# Patient Record
Sex: Male | Born: 1940 | Race: White | Hispanic: No | Marital: Married | Smoking: Former smoker
Health system: Southern US, Community
[De-identification: ages and names within clinical notes are randomized; demographics above are authoritative.]

## PROBLEM LIST (undated history)

## (undated) DIAGNOSIS — E785 Hyperlipidemia, unspecified: Secondary | ICD-10-CM

## (undated) DIAGNOSIS — E119 Type 2 diabetes mellitus without complications: Secondary | ICD-10-CM

## (undated) DIAGNOSIS — J449 Chronic obstructive pulmonary disease, unspecified: Secondary | ICD-10-CM

## (undated) HISTORY — PX: ELBOW SURGERY: SHX618

## (undated) HISTORY — DX: Hyperlipidemia, unspecified: E78.5

## (undated) HISTORY — DX: Chronic obstructive pulmonary disease, unspecified: J44.9

## (undated) HISTORY — DX: Type 2 diabetes mellitus without complications: E11.9

---

## 2007-11-05 ENCOUNTER — Ambulatory Visit: Payer: Self-pay | Admitting: Thoracic Surgery

## 2007-11-26 ENCOUNTER — Ambulatory Visit: Payer: Self-pay | Admitting: Thoracic Surgery

## 2007-11-26 ENCOUNTER — Encounter: Admission: RE | Admit: 2007-11-26 | Discharge: 2007-11-26 | Payer: Self-pay | Admitting: Thoracic Surgery

## 2008-01-14 ENCOUNTER — Ambulatory Visit: Payer: Self-pay | Admitting: Thoracic Surgery

## 2008-01-14 ENCOUNTER — Encounter: Admission: RE | Admit: 2008-01-14 | Discharge: 2008-01-14 | Payer: Self-pay | Admitting: Thoracic Surgery

## 2008-09-16 IMAGING — CT CT CHEST W/O CM
2 of 4 series · 15 of 36 positions shown, 18 images · IV contrast (agent unspecified)
Comparison: Prior chest x-ray of 11/26/07.

CLINICAL DATA: Lung nodule ? follow up. 
CT CHEST WITHOUT CONTRAST:
TECHNIQUE: Multidetector CT imaging of the chest was performed following the standard protocol without IV contrast.

[Series 3: routine chest · axial · 0.77mm/px · z∈[-275,-5]mm · 12 of 64 slices shown, 15 images]
[im 5/64  mediastinal]
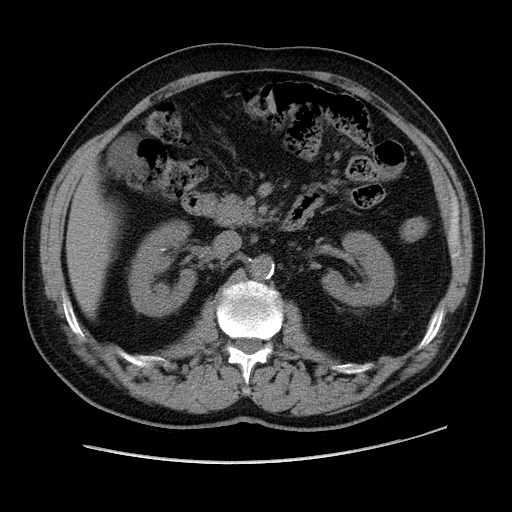
[im 5/64  lung]
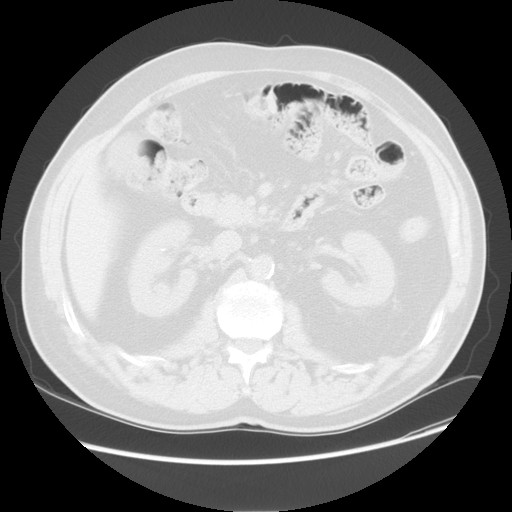
[im 10/64  lung]
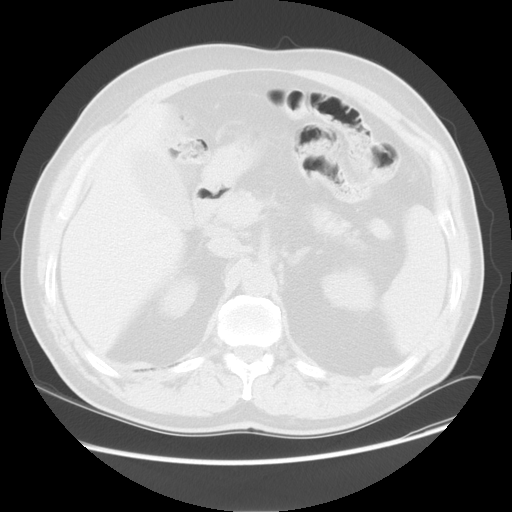
[im 14/64  lung]
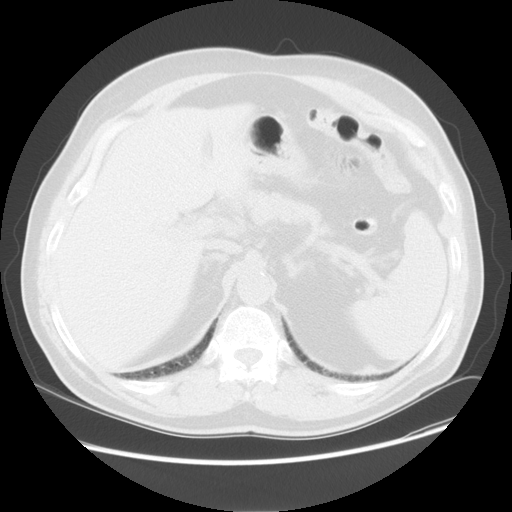
[im 19/64  lung]
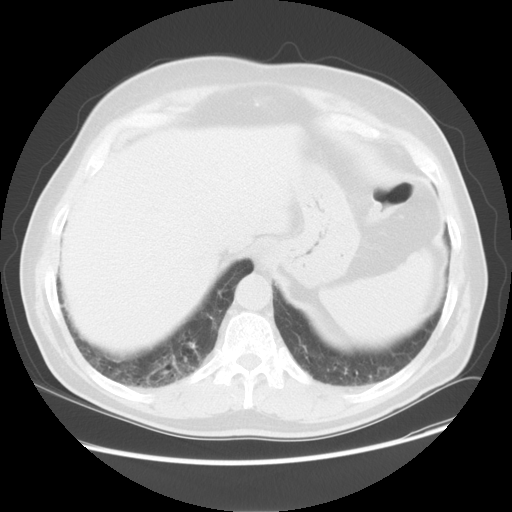
[im 23/64  mediastinal]
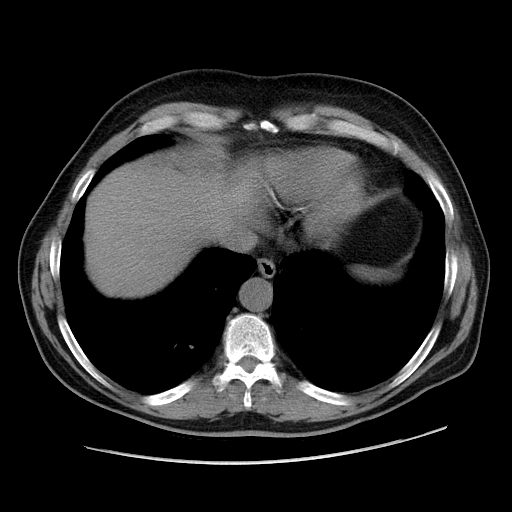
[im 23/64  lung]
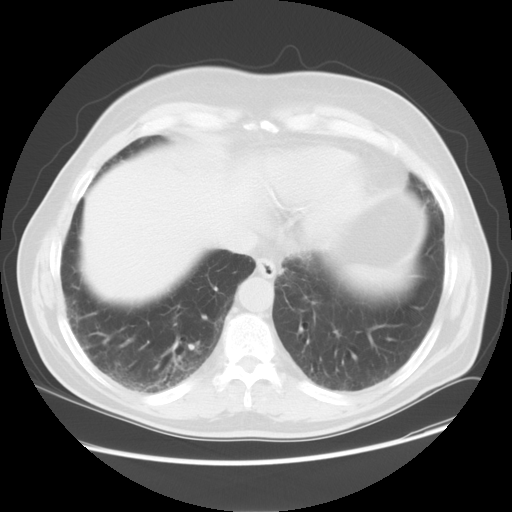
[im 28/64  lung]
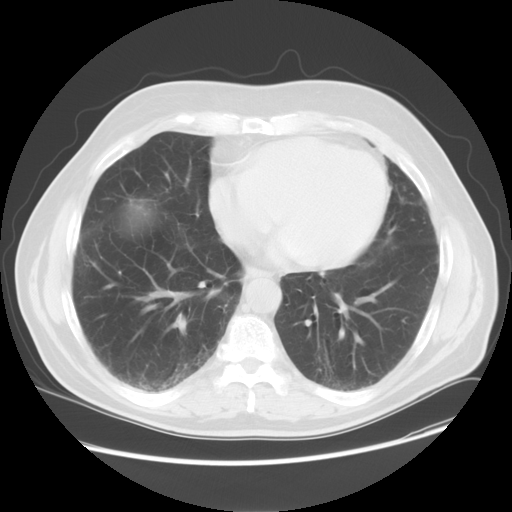
[im 37/64  lung]
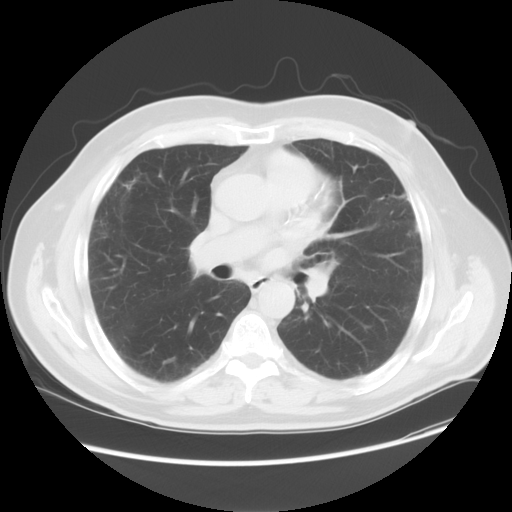
[im 41/64  lung]
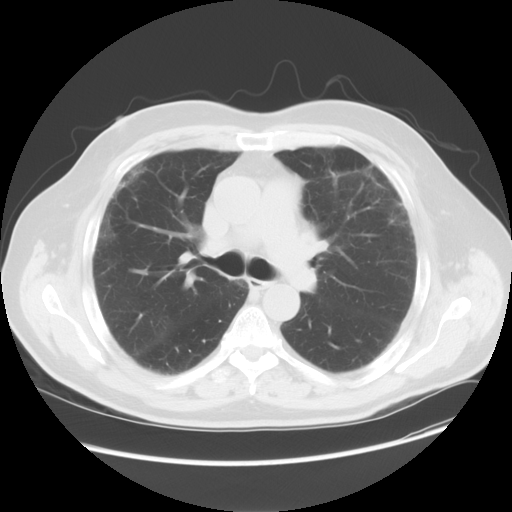
[im 46/64  mediastinal]
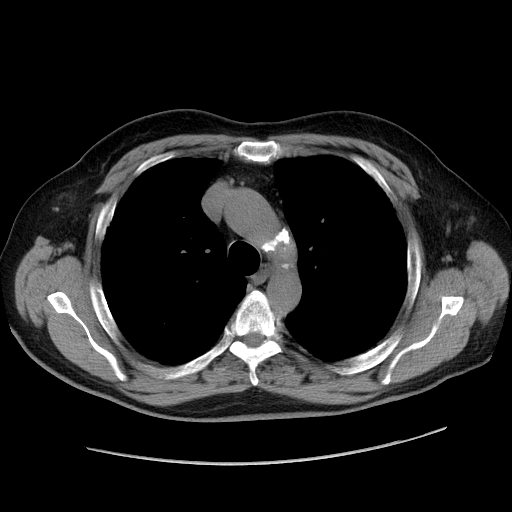
[im 46/64  lung]
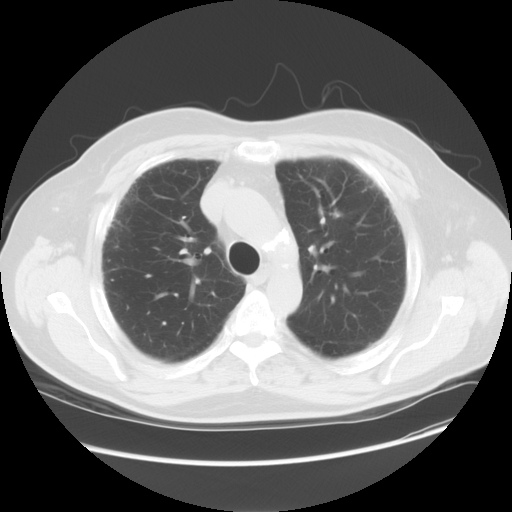
[im 50/64  lung]
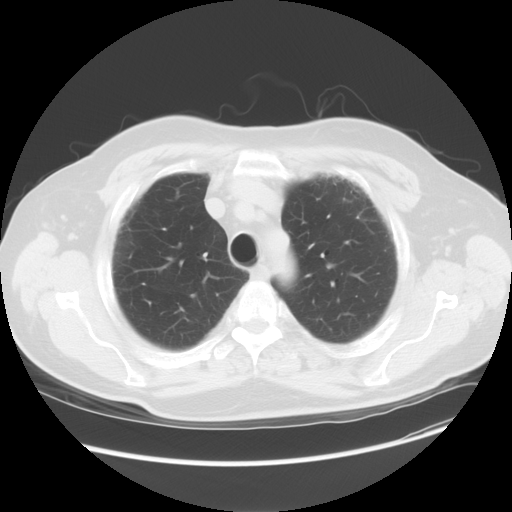
[im 55/64  lung]
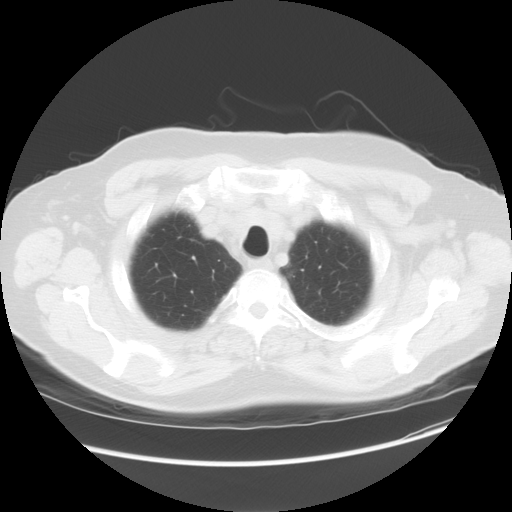
[im 59/64  lung]
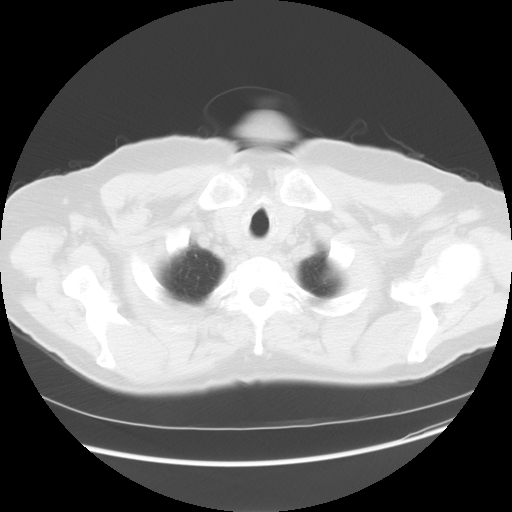

[Series 602: sagittal body · sagittal · 0.77mm/px · 3 of 158 slices shown]
[im 32/158  lung]
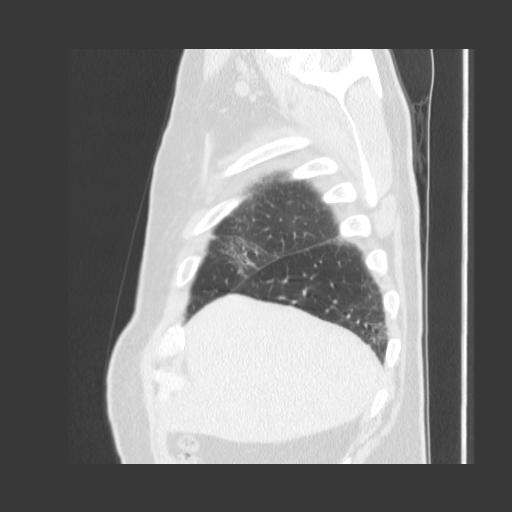
[im 63/158  lung]
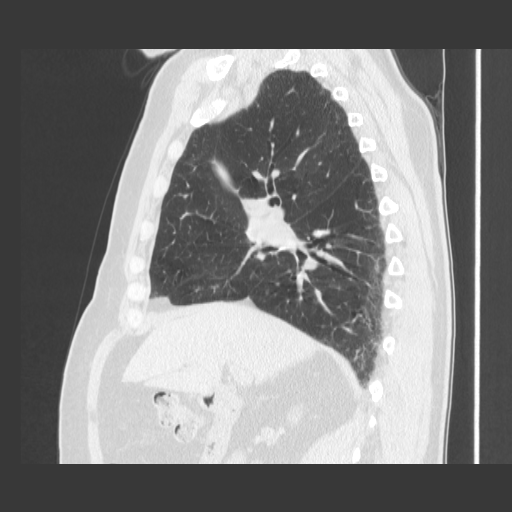
[im 95/158  lung]
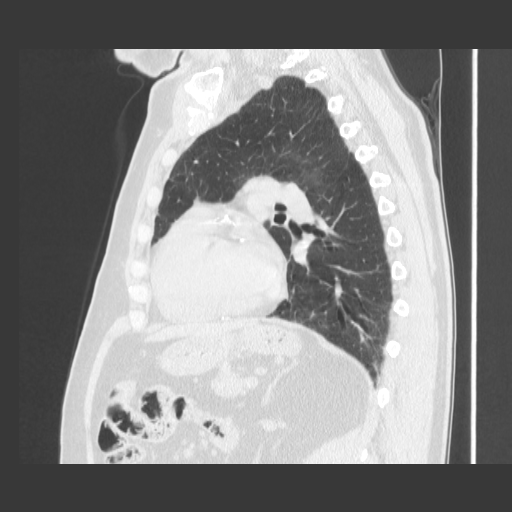

[15 of 36 positions shown; findings below may reference images not displayed]

FINDINGS: There are prominent interstitial markings primarily peripherally and predominantly in the lower lobes most consistent with chronic interstitial lung disease.  There does appear to be very mild honeycombing present and minimal traction bronchiectasis.  This pattern particularly involves the right lower lobe posteriorly and inferiorly.  No lung nodule is seen and effusion noted.  No mediastinal or hilar adenopathy is seen.  There are coronary artery calcifications present.  Bilateral non obstructing renal calculi are noted.  Partial compression of the superior endplate of L1 is noted of uncertain age.
IMPRESSION: 1.  Prominent interstitial markings primarily involving the lower lobes,  right greater than left,  with findings consistent with pulmonary fibrosis possibly UIP.  No ground glass opacity is seen. 
2.  No lung nodule is noted. 
3.  Coronary artery calcifications. 
4.  Non obstructing renal calculi. 
5.  Slight compression of L1 of question age.

## 2011-05-08 NOTE — Letter (Signed)
January 14, 2008   Tanvir A. Blenda Nicely, M.D.  [address]  [place], [place]. [address]   Re:  VALENTE, FOSBERG              DOB:  08/11/1941   Dear Dr. Blenda Nicely:   I saw Mr. Birdsall back today and repeated his CT scan.  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  I am sending him back  to see you and left you discuss the situation.  If you and he decide a  biopsy needs to be done, I will be happy to do it, but right now since  his saturations are 95% on room air, I am not sure he needs to have  anything done at the present time.   His blood pressure was 155/97, pulse 88, respirations 18.  He is still  on Coumadin.   I appreciate the opportunity to see Mr. Lowrey.   Ines Bloomer, M.D.  Electronically Signed   DPB/MEDQ  D:  01/14/2008  T:  01/14/2008  Job:  [id number]

## 2011-05-08 NOTE — Letter (Signed)
November 05, 2007   Tanvir A. Blenda Nicely, M.D.  [address]  [place], [place]. [address]   Re:  NEMO, SUDBURY              DOB:  [birth year]   Dear Dr. Blenda Nicely:   I appreciate the opportunity of seeing Edwin Burns.  This 70 year old  patient has a history of having fall respiratory tract infections with  some fever and cough.  This developed this time and got progressively  worse.  He was treated by his medical doctor for pneumonia and then had  to be admitted to the hospital and was in the hospital for 2-3 weeks  with pneumonia.  CT scan showed bilateral infiltrates with air space  disease.  He is on oxygen now, 1 liter.  He is on prednisone and has  been on antibiotics many times.  He is referred here for possible lung  biopsy.  Three days ago he actually developed swelling of his left leg,  and an ultrasound was positive for deep venous thrombosis with no  evidence of pulmonary embolus.  He had a previous transbronchial biopsy,  which was nondiagnostic.  Also, the bronchoalveolar lavage was  nondiagnostic.   PAST MEDICAL HISTORY:  Significant for hypertension,  hypercholesterolemia.   ALLERGIES:  No known drug allergies.   MEDICATIONS:  Lovenox twice daily, and he was started on 10 mg of  Coumadin.  He is on prednisone 5 mg every other day.   FAMILY HISTORY:  Positive for diabetes, cancer, and asthma.   SOCIAL HISTORY:  He is married.  He has one child.  Quit smoking in  1965.  Does not drink alcohol on a regular basis.   REVIEW OF SYSTEMS:  GENERAL:  He has had some weight loss, loss of  appetite.  He is 160 pounds and 5 feet 8 inches.  CARDIAC:  No angina or atrial fibrillation.  PULMONARY:  On home oxygen.  Has had bronchitis and a cough but no  hemoptysis.  He has also had asthma and wheezing.  GI:  No nausea, vomiting, constipation, or diarrhea.  No GERD.  GU:  Frequent urination.  VASCULAR:  He has DVT left leg.  No claudication or TIAs.  NEUROLOGIC:  No headaches,  blackouts, seizures.  MUSCULOSKELETAL:  No joint pain or rash.  PSYCHIATRIC:  No psychiatric illnesses.  EYES/ENT:  No changes in eyesight or hearing.  HEMATOLOGICAL:  No problems with anemia or clotting disorders.   PHYSICAL EXAMINATION:  General:  He is a well-developed Caucasian male  in no acute distress.  He is wearing oxygen.  His pulmonary function  test showed an FVC of 1.46 with an FEV1 of 1.56, which are 52% of  predicted.  His sats were 96% on 1 liter of oxygen.  Vital Signs:  Blood  pressure is 140/70, pulse 80, respirations 18.  Head, Eyes, Ears, Nose,  Throat:  Unremarkable.  Neck:  Supple without thyromegaly.  There is no  supraclavicular or axillary adenopathy.  Chest:  Clear to auscultation  and percussion.  Heart:  Regular sinus rhythm.  No murmurs.  Abdomen:  Soft.  There is no hepatosplenomegaly.  Extremities:  Pulses are 2+.  There is no clubbing or edema.  Neurological:  Intact.   I feel that a lung biopsy may be beneficial.  However, with having a  recent DVT, I would prefer to wait 2-3 weeks before considering a lung  biopsy, so I recommend that he come back with  a chest x-ray in 3 weeks,  and at that time we will tend towards scheduling his lung biopsy since  he will be out from his DVT.   I appreciate the opportunity to see Edwin Burns.   Sincerely,   Ines Bloomer, M.D.  Electronically Signed   DPB/MEDQ  D:  11/05/2007  T:  11/06/2007  Job:  [id number]

## 2011-05-08 NOTE — Letter (Signed)
November 26, 2007   Tanvir A. Blenda Nicely, M.D.  [address]  [place], [place]. [address]   Re:  AYAN, HEFFINGTON              DOB:  1941/11/29   Dear Dr. Blenda Nicely:   I saw Mr. Grudzien back in the office today. His chest x-ray still shows  peripheral interstitial opacities, but overall, he is stable. His  saturations off of room air were 97%. His blood pressure is 130/88,  pulse 89, and respirations 18.   We talked about doing a lung biopsy, but he still is on Coumadin from  his DVT and I would think that we could wait until after the new year  before proceeding with a biopsy. I will plan to get a repeat CT scan at  that time to look at his lungs, as well as to make sure that there is  complete resolution of his pulmonary emboli. At that time, my view is  that we can go ahead and do his lung biopsy, which I think would be  safe. Since his pulmonary status has improved, I, again, think it would  be worth getting a CT scan prior to proceeding with any type of biopsy.   Ines Bloomer, M.D.  Electronically Signed   DPB/MEDQ  D:  11/26/2007  T:  11/27/2007  Job:  [id number]

## 2012-02-28 DIAGNOSIS — I10 Essential (primary) hypertension: Secondary | ICD-10-CM | POA: Diagnosis not present

## 2012-02-28 DIAGNOSIS — E782 Mixed hyperlipidemia: Secondary | ICD-10-CM | POA: Diagnosis not present

## 2012-07-07 DIAGNOSIS — I10 Essential (primary) hypertension: Secondary | ICD-10-CM | POA: Diagnosis not present

## 2012-07-07 DIAGNOSIS — E782 Mixed hyperlipidemia: Secondary | ICD-10-CM | POA: Diagnosis not present

## 2012-08-04 DIAGNOSIS — IMO0001 Reserved for inherently not codable concepts without codable children: Secondary | ICD-10-CM | POA: Diagnosis not present

## 2012-08-04 DIAGNOSIS — Z125 Encounter for screening for malignant neoplasm of prostate: Secondary | ICD-10-CM | POA: Diagnosis not present

## 2012-08-04 DIAGNOSIS — F329 Major depressive disorder, single episode, unspecified: Secondary | ICD-10-CM | POA: Diagnosis not present

## 2012-10-10 DIAGNOSIS — Z23 Encounter for immunization: Secondary | ICD-10-CM | POA: Diagnosis not present

## 2012-10-10 DIAGNOSIS — Z125 Encounter for screening for malignant neoplasm of prostate: Secondary | ICD-10-CM | POA: Diagnosis not present

## 2012-10-29 DIAGNOSIS — Z23 Encounter for immunization: Secondary | ICD-10-CM | POA: Diagnosis not present

## 2012-11-25 DIAGNOSIS — N401 Enlarged prostate with lower urinary tract symptoms: Secondary | ICD-10-CM | POA: Diagnosis not present

## 2012-11-25 DIAGNOSIS — R972 Elevated prostate specific antigen [PSA]: Secondary | ICD-10-CM | POA: Diagnosis not present

## 2012-12-10 DIAGNOSIS — J111 Influenza due to unidentified influenza virus with other respiratory manifestations: Secondary | ICD-10-CM | POA: Diagnosis not present

## 2012-12-10 DIAGNOSIS — R062 Wheezing: Secondary | ICD-10-CM | POA: Diagnosis not present

## 2012-12-15 DIAGNOSIS — N401 Enlarged prostate with lower urinary tract symptoms: Secondary | ICD-10-CM | POA: Diagnosis not present

## 2012-12-15 DIAGNOSIS — R972 Elevated prostate specific antigen [PSA]: Secondary | ICD-10-CM | POA: Diagnosis not present

## 2013-01-05 DIAGNOSIS — E782 Mixed hyperlipidemia: Secondary | ICD-10-CM | POA: Diagnosis not present

## 2013-01-05 DIAGNOSIS — J449 Chronic obstructive pulmonary disease, unspecified: Secondary | ICD-10-CM | POA: Diagnosis not present

## 2013-01-05 DIAGNOSIS — I10 Essential (primary) hypertension: Secondary | ICD-10-CM | POA: Diagnosis not present

## 2013-01-05 DIAGNOSIS — R972 Elevated prostate specific antigen [PSA]: Secondary | ICD-10-CM | POA: Diagnosis not present

## 2013-01-28 DIAGNOSIS — J449 Chronic obstructive pulmonary disease, unspecified: Secondary | ICD-10-CM | POA: Diagnosis not present

## 2013-03-12 DIAGNOSIS — N401 Enlarged prostate with lower urinary tract symptoms: Secondary | ICD-10-CM | POA: Diagnosis not present

## 2013-03-12 DIAGNOSIS — R972 Elevated prostate specific antigen [PSA]: Secondary | ICD-10-CM | POA: Diagnosis not present

## 2013-03-23 DIAGNOSIS — R972 Elevated prostate specific antigen [PSA]: Secondary | ICD-10-CM | POA: Diagnosis not present

## 2013-03-23 DIAGNOSIS — N401 Enlarged prostate with lower urinary tract symptoms: Secondary | ICD-10-CM | POA: Diagnosis not present

## 2013-06-11 DIAGNOSIS — I10 Essential (primary) hypertension: Secondary | ICD-10-CM | POA: Diagnosis not present

## 2013-06-11 DIAGNOSIS — E782 Mixed hyperlipidemia: Secondary | ICD-10-CM | POA: Diagnosis not present

## 2013-06-24 DIAGNOSIS — R972 Elevated prostate specific antigen [PSA]: Secondary | ICD-10-CM | POA: Diagnosis not present

## 2013-06-24 DIAGNOSIS — N401 Enlarged prostate with lower urinary tract symptoms: Secondary | ICD-10-CM | POA: Diagnosis not present

## 2013-10-12 DIAGNOSIS — Z23 Encounter for immunization: Secondary | ICD-10-CM | POA: Diagnosis not present

## 2013-12-28 DIAGNOSIS — N3289 Other specified disorders of bladder: Secondary | ICD-10-CM | POA: Diagnosis not present

## 2013-12-28 DIAGNOSIS — R972 Elevated prostate specific antigen [PSA]: Secondary | ICD-10-CM | POA: Diagnosis not present

## 2013-12-28 DIAGNOSIS — N401 Enlarged prostate with lower urinary tract symptoms: Secondary | ICD-10-CM | POA: Diagnosis not present

## 2013-12-28 DIAGNOSIS — N138 Other obstructive and reflux uropathy: Secondary | ICD-10-CM | POA: Diagnosis not present

## 2014-01-14 DIAGNOSIS — I10 Essential (primary) hypertension: Secondary | ICD-10-CM | POA: Diagnosis not present

## 2014-01-14 DIAGNOSIS — E782 Mixed hyperlipidemia: Secondary | ICD-10-CM | POA: Diagnosis not present

## 2014-01-25 DIAGNOSIS — Z139 Encounter for screening, unspecified: Secondary | ICD-10-CM | POA: Diagnosis not present

## 2014-01-25 DIAGNOSIS — Z6829 Body mass index (BMI) 29.0-29.9, adult: Secondary | ICD-10-CM | POA: Diagnosis not present

## 2014-01-25 DIAGNOSIS — Z1389 Encounter for screening for other disorder: Secondary | ICD-10-CM | POA: Diagnosis not present

## 2014-01-25 DIAGNOSIS — Z0000 Encounter for general adult medical examination without abnormal findings: Secondary | ICD-10-CM | POA: Diagnosis not present

## 2014-01-25 DIAGNOSIS — E782 Mixed hyperlipidemia: Secondary | ICD-10-CM | POA: Diagnosis not present

## 2014-01-25 DIAGNOSIS — I10 Essential (primary) hypertension: Secondary | ICD-10-CM | POA: Diagnosis not present

## 2014-01-25 DIAGNOSIS — Z1331 Encounter for screening for depression: Secondary | ICD-10-CM | POA: Diagnosis not present

## 2014-05-19 DIAGNOSIS — L821 Other seborrheic keratosis: Secondary | ICD-10-CM | POA: Diagnosis not present

## 2014-05-19 DIAGNOSIS — B079 Viral wart, unspecified: Secondary | ICD-10-CM | POA: Diagnosis not present

## 2014-05-19 DIAGNOSIS — D485 Neoplasm of uncertain behavior of skin: Secondary | ICD-10-CM | POA: Diagnosis not present

## 2014-05-19 DIAGNOSIS — L578 Other skin changes due to chronic exposure to nonionizing radiation: Secondary | ICD-10-CM | POA: Diagnosis not present

## 2014-05-19 DIAGNOSIS — L570 Actinic keratosis: Secondary | ICD-10-CM | POA: Diagnosis not present

## 2014-06-28 DIAGNOSIS — N138 Other obstructive and reflux uropathy: Secondary | ICD-10-CM | POA: Diagnosis not present

## 2014-06-28 DIAGNOSIS — N401 Enlarged prostate with lower urinary tract symptoms: Secondary | ICD-10-CM | POA: Diagnosis not present

## 2014-06-28 DIAGNOSIS — R972 Elevated prostate specific antigen [PSA]: Secondary | ICD-10-CM | POA: Diagnosis not present

## 2014-07-19 DIAGNOSIS — E782 Mixed hyperlipidemia: Secondary | ICD-10-CM | POA: Diagnosis not present

## 2014-07-19 DIAGNOSIS — I10 Essential (primary) hypertension: Secondary | ICD-10-CM | POA: Diagnosis not present

## 2014-07-20 DIAGNOSIS — L821 Other seborrheic keratosis: Secondary | ICD-10-CM | POA: Diagnosis not present

## 2014-07-20 DIAGNOSIS — L570 Actinic keratosis: Secondary | ICD-10-CM | POA: Diagnosis not present

## 2014-07-26 DIAGNOSIS — Z6828 Body mass index (BMI) 28.0-28.9, adult: Secondary | ICD-10-CM | POA: Diagnosis not present

## 2014-07-26 DIAGNOSIS — E782 Mixed hyperlipidemia: Secondary | ICD-10-CM | POA: Diagnosis not present

## 2014-07-26 DIAGNOSIS — I10 Essential (primary) hypertension: Secondary | ICD-10-CM | POA: Diagnosis not present

## 2014-07-26 DIAGNOSIS — R7301 Impaired fasting glucose: Secondary | ICD-10-CM | POA: Diagnosis not present

## 2014-09-10 DIAGNOSIS — Z23 Encounter for immunization: Secondary | ICD-10-CM | POA: Diagnosis not present

## 2014-12-29 DIAGNOSIS — N401 Enlarged prostate with lower urinary tract symptoms: Secondary | ICD-10-CM | POA: Diagnosis not present

## 2014-12-29 DIAGNOSIS — R972 Elevated prostate specific antigen [PSA]: Secondary | ICD-10-CM | POA: Diagnosis not present

## 2015-01-26 DIAGNOSIS — E782 Mixed hyperlipidemia: Secondary | ICD-10-CM | POA: Diagnosis not present

## 2015-01-26 DIAGNOSIS — R7301 Impaired fasting glucose: Secondary | ICD-10-CM | POA: Diagnosis not present

## 2015-01-26 DIAGNOSIS — I10 Essential (primary) hypertension: Secondary | ICD-10-CM | POA: Diagnosis not present

## 2015-02-07 DIAGNOSIS — I10 Essential (primary) hypertension: Secondary | ICD-10-CM | POA: Diagnosis not present

## 2015-02-07 DIAGNOSIS — Z6829 Body mass index (BMI) 29.0-29.9, adult: Secondary | ICD-10-CM | POA: Diagnosis not present

## 2015-02-07 DIAGNOSIS — R7301 Impaired fasting glucose: Secondary | ICD-10-CM | POA: Diagnosis not present

## 2015-06-02 DIAGNOSIS — N401 Enlarged prostate with lower urinary tract symptoms: Secondary | ICD-10-CM | POA: Diagnosis not present

## 2015-06-02 DIAGNOSIS — N3289 Other specified disorders of bladder: Secondary | ICD-10-CM | POA: Diagnosis not present

## 2015-06-29 DIAGNOSIS — R972 Elevated prostate specific antigen [PSA]: Secondary | ICD-10-CM | POA: Diagnosis not present

## 2015-06-29 DIAGNOSIS — N3289 Other specified disorders of bladder: Secondary | ICD-10-CM | POA: Diagnosis not present

## 2015-06-29 DIAGNOSIS — N401 Enlarged prostate with lower urinary tract symptoms: Secondary | ICD-10-CM | POA: Diagnosis not present

## 2015-07-18 DIAGNOSIS — L578 Other skin changes due to chronic exposure to nonionizing radiation: Secondary | ICD-10-CM | POA: Diagnosis not present

## 2015-07-18 DIAGNOSIS — L570 Actinic keratosis: Secondary | ICD-10-CM | POA: Diagnosis not present

## 2015-07-18 DIAGNOSIS — L80 Vitiligo: Secondary | ICD-10-CM | POA: Diagnosis not present

## 2015-08-01 DIAGNOSIS — N210 Calculus in bladder: Secondary | ICD-10-CM | POA: Diagnosis not present

## 2015-08-01 DIAGNOSIS — E782 Mixed hyperlipidemia: Secondary | ICD-10-CM | POA: Diagnosis not present

## 2015-08-01 DIAGNOSIS — I10 Essential (primary) hypertension: Secondary | ICD-10-CM | POA: Diagnosis not present

## 2015-08-01 DIAGNOSIS — R7301 Impaired fasting glucose: Secondary | ICD-10-CM | POA: Diagnosis not present

## 2015-08-01 DIAGNOSIS — N200 Calculus of kidney: Secondary | ICD-10-CM | POA: Diagnosis not present

## 2015-08-01 DIAGNOSIS — R32 Unspecified urinary incontinence: Secondary | ICD-10-CM | POA: Diagnosis not present

## 2015-08-01 DIAGNOSIS — N3289 Other specified disorders of bladder: Secondary | ICD-10-CM | POA: Diagnosis not present

## 2015-08-01 DIAGNOSIS — N401 Enlarged prostate with lower urinary tract symptoms: Secondary | ICD-10-CM | POA: Diagnosis not present

## 2015-08-01 DIAGNOSIS — R350 Frequency of micturition: Secondary | ICD-10-CM | POA: Diagnosis not present

## 2015-08-08 DIAGNOSIS — E1169 Type 2 diabetes mellitus with other specified complication: Secondary | ICD-10-CM | POA: Diagnosis not present

## 2015-08-08 DIAGNOSIS — E785 Hyperlipidemia, unspecified: Secondary | ICD-10-CM | POA: Diagnosis not present

## 2015-08-08 DIAGNOSIS — E118 Type 2 diabetes mellitus with unspecified complications: Secondary | ICD-10-CM | POA: Diagnosis not present

## 2015-08-08 DIAGNOSIS — E1159 Type 2 diabetes mellitus with other circulatory complications: Secondary | ICD-10-CM | POA: Diagnosis not present

## 2015-08-11 DIAGNOSIS — Z7982 Long term (current) use of aspirin: Secondary | ICD-10-CM | POA: Diagnosis not present

## 2015-08-11 DIAGNOSIS — N210 Calculus in bladder: Secondary | ICD-10-CM | POA: Diagnosis not present

## 2015-08-11 DIAGNOSIS — E785 Hyperlipidemia, unspecified: Secondary | ICD-10-CM | POA: Diagnosis not present

## 2015-08-11 DIAGNOSIS — I10 Essential (primary) hypertension: Secondary | ICD-10-CM | POA: Diagnosis not present

## 2015-08-11 DIAGNOSIS — N3289 Other specified disorders of bladder: Secondary | ICD-10-CM | POA: Diagnosis not present

## 2015-08-11 DIAGNOSIS — N201 Calculus of ureter: Secondary | ICD-10-CM | POA: Diagnosis not present

## 2015-08-11 DIAGNOSIS — N401 Enlarged prostate with lower urinary tract symptoms: Secondary | ICD-10-CM | POA: Diagnosis not present

## 2015-08-11 DIAGNOSIS — N209 Urinary calculus, unspecified: Secondary | ICD-10-CM | POA: Diagnosis not present

## 2015-08-11 DIAGNOSIS — Z79899 Other long term (current) drug therapy: Secondary | ICD-10-CM | POA: Diagnosis not present

## 2015-08-12 DIAGNOSIS — N401 Enlarged prostate with lower urinary tract symptoms: Secondary | ICD-10-CM | POA: Diagnosis not present

## 2015-08-12 DIAGNOSIS — N210 Calculus in bladder: Secondary | ICD-10-CM | POA: Diagnosis not present

## 2015-08-31 DIAGNOSIS — Z1389 Encounter for screening for other disorder: Secondary | ICD-10-CM | POA: Diagnosis not present

## 2015-08-31 DIAGNOSIS — Z0000 Encounter for general adult medical examination without abnormal findings: Secondary | ICD-10-CM | POA: Diagnosis not present

## 2015-08-31 DIAGNOSIS — Z139 Encounter for screening, unspecified: Secondary | ICD-10-CM | POA: Diagnosis not present

## 2015-09-02 DIAGNOSIS — N401 Enlarged prostate with lower urinary tract symptoms: Secondary | ICD-10-CM | POA: Diagnosis not present

## 2015-09-02 DIAGNOSIS — N3289 Other specified disorders of bladder: Secondary | ICD-10-CM | POA: Diagnosis not present

## 2015-09-19 DIAGNOSIS — D485 Neoplasm of uncertain behavior of skin: Secondary | ICD-10-CM | POA: Diagnosis not present

## 2015-09-19 DIAGNOSIS — L570 Actinic keratosis: Secondary | ICD-10-CM | POA: Diagnosis not present

## 2015-09-19 DIAGNOSIS — L304 Erythema intertrigo: Secondary | ICD-10-CM | POA: Diagnosis not present

## 2015-09-19 DIAGNOSIS — L820 Inflamed seborrheic keratosis: Secondary | ICD-10-CM | POA: Diagnosis not present

## 2015-09-19 DIAGNOSIS — L819 Disorder of pigmentation, unspecified: Secondary | ICD-10-CM | POA: Diagnosis not present

## 2015-10-15 DIAGNOSIS — Z23 Encounter for immunization: Secondary | ICD-10-CM | POA: Diagnosis not present

## 2015-10-26 DIAGNOSIS — R972 Elevated prostate specific antigen [PSA]: Secondary | ICD-10-CM | POA: Diagnosis not present

## 2015-10-26 DIAGNOSIS — N3289 Other specified disorders of bladder: Secondary | ICD-10-CM | POA: Diagnosis not present

## 2015-10-26 DIAGNOSIS — N401 Enlarged prostate with lower urinary tract symptoms: Secondary | ICD-10-CM | POA: Diagnosis not present

## 2015-11-30 DIAGNOSIS — R7301 Impaired fasting glucose: Secondary | ICD-10-CM | POA: Diagnosis not present

## 2015-11-30 DIAGNOSIS — I10 Essential (primary) hypertension: Secondary | ICD-10-CM | POA: Diagnosis not present

## 2015-11-30 DIAGNOSIS — E782 Mixed hyperlipidemia: Secondary | ICD-10-CM | POA: Diagnosis not present

## 2015-12-12 DIAGNOSIS — E785 Hyperlipidemia, unspecified: Secondary | ICD-10-CM | POA: Diagnosis not present

## 2015-12-12 DIAGNOSIS — Z6828 Body mass index (BMI) 28.0-28.9, adult: Secondary | ICD-10-CM | POA: Diagnosis not present

## 2015-12-12 DIAGNOSIS — M2540 Effusion, unspecified joint: Secondary | ICD-10-CM | POA: Diagnosis not present

## 2015-12-12 DIAGNOSIS — R7303 Prediabetes: Secondary | ICD-10-CM | POA: Diagnosis not present

## 2016-04-24 DIAGNOSIS — N401 Enlarged prostate with lower urinary tract symptoms: Secondary | ICD-10-CM | POA: Diagnosis not present

## 2016-04-24 DIAGNOSIS — R972 Elevated prostate specific antigen [PSA]: Secondary | ICD-10-CM | POA: Diagnosis not present

## 2016-05-29 DIAGNOSIS — E785 Hyperlipidemia, unspecified: Secondary | ICD-10-CM | POA: Diagnosis not present

## 2016-05-29 DIAGNOSIS — I10 Essential (primary) hypertension: Secondary | ICD-10-CM | POA: Diagnosis not present

## 2016-05-29 DIAGNOSIS — R7303 Prediabetes: Secondary | ICD-10-CM | POA: Diagnosis not present

## 2016-06-12 DIAGNOSIS — L80 Vitiligo: Secondary | ICD-10-CM | POA: Diagnosis not present

## 2016-06-12 DIAGNOSIS — E785 Hyperlipidemia, unspecified: Secondary | ICD-10-CM | POA: Diagnosis not present

## 2016-06-12 DIAGNOSIS — Z1389 Encounter for screening for other disorder: Secondary | ICD-10-CM | POA: Diagnosis not present

## 2016-06-12 DIAGNOSIS — I10 Essential (primary) hypertension: Secondary | ICD-10-CM | POA: Diagnosis not present

## 2016-06-12 DIAGNOSIS — R05 Cough: Secondary | ICD-10-CM | POA: Diagnosis not present

## 2016-08-22 DIAGNOSIS — Z6828 Body mass index (BMI) 28.0-28.9, adult: Secondary | ICD-10-CM | POA: Diagnosis not present

## 2016-08-22 DIAGNOSIS — K4090 Unilateral inguinal hernia, without obstruction or gangrene, not specified as recurrent: Secondary | ICD-10-CM | POA: Diagnosis not present

## 2016-09-18 DIAGNOSIS — K4090 Unilateral inguinal hernia, without obstruction or gangrene, not specified as recurrent: Secondary | ICD-10-CM | POA: Diagnosis not present

## 2016-09-18 DIAGNOSIS — D171 Benign lipomatous neoplasm of skin and subcutaneous tissue of trunk: Secondary | ICD-10-CM | POA: Diagnosis not present

## 2016-09-18 DIAGNOSIS — K429 Umbilical hernia without obstruction or gangrene: Secondary | ICD-10-CM | POA: Diagnosis not present

## 2016-12-04 DIAGNOSIS — I10 Essential (primary) hypertension: Secondary | ICD-10-CM | POA: Diagnosis not present

## 2016-12-04 DIAGNOSIS — E785 Hyperlipidemia, unspecified: Secondary | ICD-10-CM | POA: Diagnosis not present

## 2016-12-04 DIAGNOSIS — R7303 Prediabetes: Secondary | ICD-10-CM | POA: Diagnosis not present

## 2016-12-10 DIAGNOSIS — R7303 Prediabetes: Secondary | ICD-10-CM | POA: Diagnosis not present

## 2016-12-10 DIAGNOSIS — E785 Hyperlipidemia, unspecified: Secondary | ICD-10-CM | POA: Diagnosis not present

## 2016-12-10 DIAGNOSIS — J449 Chronic obstructive pulmonary disease, unspecified: Secondary | ICD-10-CM | POA: Diagnosis not present

## 2016-12-10 DIAGNOSIS — I10 Essential (primary) hypertension: Secondary | ICD-10-CM | POA: Diagnosis not present

## 2017-06-11 DIAGNOSIS — R7303 Prediabetes: Secondary | ICD-10-CM | POA: Diagnosis not present

## 2017-06-11 DIAGNOSIS — E785 Hyperlipidemia, unspecified: Secondary | ICD-10-CM | POA: Diagnosis not present

## 2017-06-11 DIAGNOSIS — I10 Essential (primary) hypertension: Secondary | ICD-10-CM | POA: Diagnosis not present

## 2020-04-06 ENCOUNTER — Other Ambulatory Visit: Payer: Self-pay

## 2020-04-06 ENCOUNTER — Encounter: Payer: Self-pay | Admitting: Pulmonary Disease

## 2020-04-06 ENCOUNTER — Ambulatory Visit: Payer: Medicare Other | Admitting: Pulmonary Disease

## 2020-04-06 VITALS — BP 112/75 | HR 67 | Temp 97.4°F | Ht 64.76 in | Wt 154.2 lb

## 2020-04-06 DIAGNOSIS — J849 Interstitial pulmonary disease, unspecified: Secondary | ICD-10-CM | POA: Diagnosis not present

## 2020-04-06 LAB — SEDIMENTATION RATE: Sed Rate: 35 mm/hr — ABNORMAL HIGH (ref 0–20)

## 2020-04-06 NOTE — Progress Notes (Signed)
Edwin Burns    [id number]    1941-11-10  Primary Care Physician:Hodges, Para March, MD  Referring Physician: Charlott Rakes, MD 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 olmesartan-hydrochlorothiazide (BENICAR HCT) 40-12.5 MG tablet Take 1 tablet by mouth daily.  . ranitidine (ZANTAC) 15 MG/ML syrup Take 10 mg by mouth 2 (two) times daily.  . rosuvastatin (CRESTOR) 5 MG tablet Take 5 mg by mouth daily.  . tamsulosin (FLOMAX) 0.4 MG CAPS capsule Take 0.8 mg by mouth daily after breakfast.  . Turmeric (QC TUMERIC COMPLEX) 500 MG CAPS Take 1,000 mg by mouth daily.   No facility-administered encounter medications on file as of 04/06/2020.    Allergies as of 04/06/2020 - Review Complete 04/06/2020  Allergen Reaction Noted  . Ambien [zolpidem] Other (See Comments) 04/06/2020    History reviewed.  History significant for hyperlipidemia Hypertension Chronic obstructive pulmonary disease Prediabetic  Family  history significant for father having diabetes Mother had Hodgkin's lymphoma   Social History   Socioeconomic History  . Marital status: Married    Spouse name: Not on file  . Number of children: Not on file  . Years of education: Not on file  . Highest education level: Not on file  Occupational History  . Not on file  Tobacco Use  . Smoking status: Former Smoker    Packs/day: 1.00    Years: 15.00    Pack years: 15.00    Types: Cigarettes    Quit date: 1965    Years since quitting: 56.3  . Smokeless tobacco: Never Used  Substance and Sexual Activity  . Alcohol use: Not on file  . Drug use: Not on file  . Sexual activity: Not on file  Other Topics Concern  . Not on file  Social History Narrative  . Not on file   Social Determinants of Health   Financial Resource Strain:   . Difficulty of Paying Living Expenses:   Food Insecurity:   . Worried About Programme researcher, broadcasting/film/video in the Last Year:   . Barista in the Last Year:   Transportation Needs:   . Freight forwarder (Medical):   Marland Kitchen Lack of Transportation (Non-Medical):   Physical Activity:   . Days of Exercise per Week:   . Minutes of Exercise per Session:   Stress:   . Feeling of Stress :   Social Connections:   . Frequency of Communication with Friends and Family:   . Frequency of Social Gatherings with Friends and  Family:   . Attends Religious Services:   . Active Member of Clubs or Organizations:   . Attends Banker Meetings:   Marland Kitchen Marital Status:   Intimate Partner Violence:   . Fear of Current or Ex-Partner:   . Emotionally Abused:   Marland Kitchen Physically Abused:   . Sexually Abused:     Review of Systems  Constitutional: Negative.   HENT: Negative.   Respiratory: Negative for cough and shortness of breath.   Musculoskeletal: Positive for arthralgias and back pain.  Skin: Negative.     Vitals:   04/06/20 1344  BP: 112/75  Pulse: 67  Temp: (!) 97.4 F (36.3 C)  SpO2: 97%      Physical Exam  Constitutional: He appears well-developed and well-nourished.  HENT:  Head: Normocephalic and atraumatic.  Eyes: Pupils are equal, round, and reactive to light. Conjunctivae are normal. Right eye exhibits no discharge.  Neck: No tracheal deviation present. No thyromegaly present.  Cardiovascular: Normal rate and regular rhythm.  Pulmonary/Chest: Effort normal. No respiratory distress. He has no wheezes. He has rales. He exhibits no tenderness.  Abdominal: Soft.  Musculoskeletal:        General: No edema. Normal range of motion.     Cervical back: Normal range of motion and neck supple.  Neurological: He is alert.  Skin: Skin is warm and dry. No erythema.  Psychiatric: He has a normal mood and affect.     Data Reviewed: CT scan of upper chest performed at [facility] on 01/05/2020 does reveal basilar predominant subpleural interstitial fibrotic change with honeycombing, bronchiectasis and bronchiolectasis Previous CT from 2008 was reviewed showing extensive multifocal infiltrate. 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 -Patient has no significant symptoms at present, denies cough, denies shortness of breath -He is functioning relatively well apart from limitations related to his hip and knee pain  Predisposing factors to pulmonary fibrosis discussed  Plan/Recommendations: Obtain pulmonary function function test 6-minute walk Serum markers ANA, rheumatoid factor, SCL 70, Sjogren's syndrome antibodies Hypersensitivity pneumonitis AntidsDNA, ANCA  I did provide information about Ofev and Esbriet  I will see him back in the office in about 4 weeks Encouraged to call with any significant concerns His son was present during the visit  He is aware that this is a progressive disease   Virl Diamond MD [facility] 04/06/2020, 9:11 PM  CC: Charlott Rakes, MD

## 2020-04-06 NOTE — Patient Instructions (Signed)
You were seen today for scarring in the lungs  We will get some blood work, breathing study to assess predisposing factors  I will see you in 4 weeks  Medications used to treat scarring -OFEV -ESBRIET  The hope is to slow down the process We have no test that tells [place] will progress rapidly It is a progressive disease  Call with concerns

## 2020-04-08 LAB — ANCA SCREEN W REFLEX TITER: ANCA Screen: NEGATIVE

## 2020-04-08 LAB — ANTI-SCLERODERMA ANTIBODY: Scleroderma (Scl-70) (ENA) Antibody, IgG: <1 AI

## 2020-04-08 LAB — ANTI-NUCLEAR AB-TITER (ANA TITER): ANA Titer 1: 1:320 {titer} — ABNORMAL HIGH

## 2020-04-08 LAB — CYCLIC CITRUL PEPTIDE ANTIBODY, IGG: Cyclic Citrullin Peptide Ab: <16 UNITS

## 2020-04-08 LAB — SJOGREN'S SYNDROME ANTIBODS(SSA + SSB)
SSA (Ro) (ENA) Antibody, IgG: <1 AI
SSB (La) (ENA) Antibody, IgG: <1 AI

## 2020-04-08 LAB — RHEUMATOID FACTOR: Rhuematoid fact SerPl-aCnc: <14 IU/mL (ref <14)

## 2020-04-08 LAB — ANA: Anti Nuclear Antibody (ANA): POSITIVE — AB

## 2020-04-08 LAB — ANTI-DNA ANTIBODY, DOUBLE-STRANDED: ds DNA Ab: 9 IU/mL — ABNORMAL HIGH

## 2020-04-11 LAB — HYPERSENSITIVITY PNEUMONITIS
A. Pullulans Abs: NEGATIVE
A.Fumigatus #1 Abs: NEGATIVE
Micropolyspora faeni, IgG: NEGATIVE
Pigeon Serum Abs: NEGATIVE
Thermoact. Saccharii: NEGATIVE
Thermoactinomyces vulgaris, IgG: NEGATIVE

## 2020-04-26 ENCOUNTER — Other Ambulatory Visit (HOSPITAL_COMMUNITY)
Admission: RE | Admit: 2020-04-26 | Discharge: 2020-04-26 | Disposition: A | Discharge status: Home / Self Care | Payer: Medicare Other | Source: Ambulatory Visit | Attending: Pulmonary Disease | Admitting: Pulmonary Disease

## 2020-04-26 DIAGNOSIS — Z20822 Contact with and (suspected) exposure to covid-19: Secondary | ICD-10-CM | POA: Insufficient documentation

## 2020-04-26 DIAGNOSIS — Z01812 Encounter for preprocedural laboratory examination: Secondary | ICD-10-CM | POA: Insufficient documentation

## 2020-04-26 LAB — SARS CORONAVIRUS 2 (TAT 6-24 HRS): SARS Coronavirus 2: NEGATIVE

## 2020-04-29 ENCOUNTER — Ambulatory Visit (INDEPENDENT_AMBULATORY_CARE_PROVIDER_SITE_OTHER): Payer: Medicare Other | Admitting: Pulmonary Disease

## 2020-04-29 ENCOUNTER — Other Ambulatory Visit: Payer: Self-pay

## 2020-04-29 DIAGNOSIS — J849 Interstitial pulmonary disease, unspecified: Secondary | ICD-10-CM | POA: Diagnosis not present

## 2020-04-29 LAB — PULMONARY FUNCTION TEST
DL/VA % pred: 114 %
DL/VA: 4.54 ml/min/mmHg/L
DLCO cor % pred: 109 %
DLCO cor: 22.46 ml/min/mmHg
DLCO unc % pred: 109 %
DLCO unc: 22.46 ml/min/mmHg
FEF 25-75 Post: 5.05 L/sec
FEF 25-75 Pre: 4.4 L/sec
FEF2575-%Change-Post: 14 %
FEF2575-%Pred-Post: 341 %
FEF2575-%Pred-Pre: 297 %
FEV1-%Change-Post: 2 %
FEV1-%Pred-Post: 145 %
FEV1-%Pred-Pre: 142 %
FEV1-Post: 3.2 L
FEV1-Pre: 3.12 L
FEV1FVC-%Change-Post: 0 %
FEV1FVC-%Pred-Pre: 125 %
FEV6-%Change-Post: 2 %
FEV6-%Pred-Post: 123 %
FEV6-%Pred-Pre: 120 %
FEV6-Post: 3.57 L
FEV6-Pre: 3.48 L
FEV6FVC-%Pred-Post: 108 %
FEV6FVC-%Pred-Pre: 108 %
FVC-%Change-Post: 2 %
FVC-%Pred-Post: 114 %
FVC-%Pred-Pre: 110 %
FVC-Post: 3.58 L
FVC-Pre: 3.48 L
Post FEV1/FVC ratio: 89 %
Post FEV6/FVC ratio: 100 %
Pre FEV1/FVC ratio: 90 %
Pre FEV6/FVC Ratio: 100 %
RV % pred: 30 %
RV: 0.71 L
TLC % pred: 71 %
TLC: 4.23 L

## 2020-04-29 NOTE — Progress Notes (Signed)
Full PFT performed today. °

## 2020-05-05 ENCOUNTER — Ambulatory Visit (INDEPENDENT_AMBULATORY_CARE_PROVIDER_SITE_OTHER): Payer: Medicare Other

## 2020-05-05 ENCOUNTER — Other Ambulatory Visit: Payer: Self-pay

## 2020-05-05 ENCOUNTER — Encounter: Payer: Self-pay | Admitting: Pulmonary Disease

## 2020-05-05 ENCOUNTER — Ambulatory Visit: Payer: Medicare Other | Admitting: Pulmonary Disease

## 2020-05-05 VITALS — BP 130/80 | HR 78 | Ht 63.0 in | Wt 152.2 lb

## 2020-05-05 DIAGNOSIS — J849 Interstitial pulmonary disease, unspecified: Secondary | ICD-10-CM

## 2020-05-05 DIAGNOSIS — J84112 Idiopathic pulmonary fibrosis: Secondary | ICD-10-CM

## 2020-05-05 NOTE — Progress Notes (Signed)
Edwin Burns    [id number]    September 12, 1941  Primary Care Physician:Hodges, Para March, MD  Referring Physician: Charlott Rakes, MD [address] [place],  [place] [address]  Chief complaint:   Patient is being seen for abnormal CT findings-in for follow-up today  HPI:  Patient denies any significant complaints today Denies significant shortness of breath Denies chronic cough  Since the last visit ANA positive1:320 PFT with restrictive disease 6-minute walk 306 m No exercise desaturations  He remains asymptomatic  Velcro-like rales being noted was the reason for referral to pulmonary  Exercise limitation largely related to hip and knee pain  Respiratory history significant for pneumonia in 2008, was very severe at the time, recovered fully  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 olmesartan-hydrochlorothiazide (BENICAR HCT) 40-12.5 MG tablet Take 1 tablet by mouth daily.  . ranitidine (ZANTAC) 15 MG/ML syrup Take 10 mg by mouth 2 (two) times daily.  . rosuvastatin (CRESTOR) 5 MG tablet Take 5 mg by mouth daily.  . tamsulosin (FLOMAX) 0.4 MG CAPS capsule Take 0.8 mg by mouth daily after breakfast.  . Turmeric (QC TUMERIC COMPLEX) 500 MG CAPS Take 1,000 mg by mouth daily.   No facility-administered encounter medications on file as of 05/05/2020.    Allergies as of 05/05/2020 - Review Complete 05/05/2020  Allergen Reaction Noted  . Ambien [zolpidem] Other (See Comments) 04/06/2020    History  reviewed.  History significant for hyperlipidemia Hypertension Chronic obstructive pulmonary disease Prediabetic  Family history significant for father having diabetes Mother had Hodgkin's lymphoma   Social History   Socioeconomic History  . Marital status: Married    Spouse name: Not on file  . Number of children: Not on file  . Years of education: Not on file  . Highest education level: Not on file  Occupational History  . Not on file  Tobacco Use  . Smoking status: Former Smoker    Packs/day: 1.00    Years: 15.00    Pack years: 15.00    Types: Cigarettes    Quit date: 1965    Years since quitting: 56.4  . Smokeless tobacco: Never Used  Substance and Sexual Activity  . Alcohol use: Not on file  . Drug use: Not on file  . Sexual activity: Not on file  Other Topics Concern  . Not on file  Social History Narrative  . Not on file   Social Determinants of Health   Financial Resource Strain:   . Difficulty of Paying Living Expenses:   Food Insecurity:   . Worried About Programme researcher, broadcasting/film/video in the Last Year:   . Barista in the Last Year:   Transportation Needs:   . Freight forwarder (Medical):   Marland Kitchen Lack of Transportation (Non-Medical):   Physical Activity:   . Days of Exercise per Week:   . Minutes of Exercise per Session:   Stress:   . Feeling of Stress :   Social Connections:   .  Frequency of Communication with Friends and Family:   . Frequency of Social Gatherings with Friends and Family:   . Attends Religious Services:   . Active Member of Clubs or Organizations:   . Attends Banker Meetings:   Marland Kitchen Marital Status:   Intimate Partner Violence:   . Fear of Current or Ex-Partner:   . Emotionally Abused:   Marland Kitchen Physically Abused:   . Sexually Abused:     Review of Systems  Constitutional: Negative.   HENT: Negative.   Respiratory: Negative.  Negative for cough and shortness of breath.   Musculoskeletal: Positive for arthralgias and  back pain.  Skin: Negative.     Vitals:   05/05/20 0914  BP: 130/80  Pulse: 78  SpO2: 98%     Physical Exam  Constitutional: He appears well-developed and well-nourished.  HENT:  Head: Normocephalic and atraumatic.  Eyes: Pupils are equal, round, and reactive to light. Conjunctivae are normal. Right eye exhibits no discharge.  Neck: No tracheal deviation present. No thyromegaly present.  Cardiovascular: Normal rate and regular rhythm.  Pulmonary/Chest: Effort normal. No respiratory distress. He has no wheezes. He has rales. He exhibits no tenderness.  Abdominal: Soft.  Neurological: He is alert.  Skin: Skin is warm.     Data Reviewed: CT was rereviewed  with the patient today CT scan of upper chest performed at [facility] on 01/05/2020 does reveal basilar predominant subpleural interstitial fibrotic change with honeycombing, bronchiectasis and bronchiolectasis Previous CT from 2008 was reviewed showing extensive multifocal infiltrate.  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 with elevated ANA markers  He is still functioning relatively well with only limitations relating to hip and knee pain   Plan/Recommendations: Obtain high-resolution CT  Referral to rheumatology for further evaluation  I did provide information about Ofev and Esbriet  Tentatively, I will see him in 6 months He is reluctant for any medications or further treatment Son was present during the visit and following discussions with the patient and he agrees to be evaluated by rheumatology  Encouraged to call with any significant concerns He is aware that this is a progressive disease   Virl Diamond MD [facility] 05/05/2020, 10:01 AM  CC: Charlott Rakes, MD

## 2020-05-05 NOTE — Progress Notes (Signed)
Six Minute Walk - 05/05/20 0914      Six Minute Walk   Medications taken before test (dose and time)  olmesartan medoxomil-HCTZ 40-12.5mg  and tamsulosin 0.4mg  at 6am    Supplemental oxygen during test?  No    Lap distance in meters   34 meters    Laps Completed  9    Partial lap (in meters)  0 meters    Baseline BP (sitting)  122/80    Baseline Heartrate  63    Baseline Dyspnea (Borg Scale)  0    Baseline Fatigue (Borg Scale)  1    Baseline SPO2  96 %      End of Test Values    BP (sitting)  140/90    Heartrate  93    Dyspnea (Borg Scale)  2    Fatigue (Borg Scale)  3    SPO2  90 %      2 Minutes Post Walk Values   BP (sitting)  130/80    Heartrate  78    SPO2  98 %    Stopped or paused before six minutes?  No    Other Symptoms at end of exercise:  Hip pain   knee pain both symptoms are not new symptoms     Interpretation   Distance completed  306 meters    Tech Comments:  Pt walked at an average pace with a limp due to left hip and knee pain which he has had for awhile. Pt talked all throughout the walk. Completed entire 6 min without stopping.

## 2020-05-05 NOTE — Patient Instructions (Signed)
Referral to rheumatology  High resolution CT scan of the chest  Tentatively follow-up appointment in 6 months  Call with any significant concerns  Call if you do not hear from [place] following the CT scan of the chest as well

## 2020-05-09 ENCOUNTER — Telehealth: Payer: Self-pay | Admitting: Pulmonary Disease

## 2020-05-09 NOTE — Telephone Encounter (Signed)
ATC Patient's son, Francee Piccolo.  LM to call back.

## 2020-05-10 NOTE — Telephone Encounter (Signed)
Spoke with the pt's son, Francee Piccolo  He states that the rheumatologist that we set pt up with is unable to see him until Sept  He is asking if there is another office that could see him sooner  Please advise PCC's thanks

## 2020-05-10 NOTE — Telephone Encounter (Signed)
I will work on this. °

## 2020-05-11 NOTE — Telephone Encounter (Signed)
Referral placed in proficient for [facility].

## 2020-05-16 ENCOUNTER — Ambulatory Visit
Admission: RE | Admit: 2020-05-16 | Discharge: 2020-05-16 | Disposition: A | Discharge status: Home / Self Care | Payer: Medicare Other | Source: Ambulatory Visit | Attending: Pulmonary Disease | Admitting: Pulmonary Disease

## 2020-05-16 DIAGNOSIS — J84112 Idiopathic pulmonary fibrosis: Secondary | ICD-10-CM

## 2021-01-24 DIAGNOSIS — E118 Type 2 diabetes mellitus with unspecified complications: Secondary | ICD-10-CM | POA: Diagnosis not present

## 2021-01-24 DIAGNOSIS — I10 Essential (primary) hypertension: Secondary | ICD-10-CM | POA: Diagnosis not present

## 2021-01-24 DIAGNOSIS — J449 Chronic obstructive pulmonary disease, unspecified: Secondary | ICD-10-CM | POA: Diagnosis not present

## 2021-01-24 DIAGNOSIS — E785 Hyperlipidemia, unspecified: Secondary | ICD-10-CM | POA: Diagnosis not present

## 2021-02-21 DIAGNOSIS — E118 Type 2 diabetes mellitus with unspecified complications: Secondary | ICD-10-CM | POA: Diagnosis not present

## 2021-02-21 DIAGNOSIS — I10 Essential (primary) hypertension: Secondary | ICD-10-CM | POA: Diagnosis not present

## 2021-02-21 DIAGNOSIS — E785 Hyperlipidemia, unspecified: Secondary | ICD-10-CM | POA: Diagnosis not present

## 2021-02-21 DIAGNOSIS — J449 Chronic obstructive pulmonary disease, unspecified: Secondary | ICD-10-CM | POA: Diagnosis not present

## 2021-04-03 DIAGNOSIS — E785 Hyperlipidemia, unspecified: Secondary | ICD-10-CM | POA: Diagnosis not present

## 2021-04-03 DIAGNOSIS — E118 Type 2 diabetes mellitus with unspecified complications: Secondary | ICD-10-CM | POA: Diagnosis not present

## 2021-04-10 DIAGNOSIS — Z1331 Encounter for screening for depression: Secondary | ICD-10-CM | POA: Diagnosis not present

## 2021-04-10 DIAGNOSIS — E785 Hyperlipidemia, unspecified: Secondary | ICD-10-CM | POA: Diagnosis not present

## 2021-04-10 DIAGNOSIS — Z139 Encounter for screening, unspecified: Secondary | ICD-10-CM | POA: Diagnosis not present

## 2021-04-10 DIAGNOSIS — J449 Chronic obstructive pulmonary disease, unspecified: Secondary | ICD-10-CM | POA: Diagnosis not present

## 2021-04-10 DIAGNOSIS — Z0000 Encounter for general adult medical examination without abnormal findings: Secondary | ICD-10-CM | POA: Diagnosis not present

## 2021-04-10 DIAGNOSIS — Z136 Encounter for screening for cardiovascular disorders: Secondary | ICD-10-CM | POA: Diagnosis not present

## 2021-04-10 DIAGNOSIS — Z1339 Encounter for screening examination for other mental health and behavioral disorders: Secondary | ICD-10-CM | POA: Diagnosis not present

## 2021-04-10 DIAGNOSIS — J84112 Idiopathic pulmonary fibrosis: Secondary | ICD-10-CM | POA: Diagnosis not present

## 2021-04-10 DIAGNOSIS — E118 Type 2 diabetes mellitus with unspecified complications: Secondary | ICD-10-CM | POA: Diagnosis not present

## 2021-04-10 DIAGNOSIS — Z7189 Other specified counseling: Secondary | ICD-10-CM | POA: Diagnosis not present

## 2021-04-23 DIAGNOSIS — E785 Hyperlipidemia, unspecified: Secondary | ICD-10-CM | POA: Diagnosis not present

## 2021-04-23 DIAGNOSIS — J449 Chronic obstructive pulmonary disease, unspecified: Secondary | ICD-10-CM | POA: Diagnosis not present

## 2021-04-23 DIAGNOSIS — I10 Essential (primary) hypertension: Secondary | ICD-10-CM | POA: Diagnosis not present

## 2021-04-23 DIAGNOSIS — E119 Type 2 diabetes mellitus without complications: Secondary | ICD-10-CM | POA: Diagnosis not present

## 2021-05-03 DIAGNOSIS — N401 Enlarged prostate with lower urinary tract symptoms: Secondary | ICD-10-CM | POA: Diagnosis not present

## 2021-05-03 DIAGNOSIS — R21 Rash and other nonspecific skin eruption: Secondary | ICD-10-CM | POA: Diagnosis not present

## 2021-05-03 DIAGNOSIS — R972 Elevated prostate specific antigen [PSA]: Secondary | ICD-10-CM | POA: Diagnosis not present

## 2021-05-24 DIAGNOSIS — I10 Essential (primary) hypertension: Secondary | ICD-10-CM | POA: Diagnosis not present

## 2021-05-24 DIAGNOSIS — J449 Chronic obstructive pulmonary disease, unspecified: Secondary | ICD-10-CM | POA: Diagnosis not present

## 2021-05-24 DIAGNOSIS — E785 Hyperlipidemia, unspecified: Secondary | ICD-10-CM | POA: Diagnosis not present

## 2021-06-23 DIAGNOSIS — J449 Chronic obstructive pulmonary disease, unspecified: Secondary | ICD-10-CM | POA: Diagnosis not present

## 2021-06-23 DIAGNOSIS — I10 Essential (primary) hypertension: Secondary | ICD-10-CM | POA: Diagnosis not present

## 2021-06-23 DIAGNOSIS — E785 Hyperlipidemia, unspecified: Secondary | ICD-10-CM | POA: Diagnosis not present

## 2021-09-01 DIAGNOSIS — E118 Type 2 diabetes mellitus with unspecified complications: Secondary | ICD-10-CM | POA: Diagnosis not present

## 2021-09-01 DIAGNOSIS — E785 Hyperlipidemia, unspecified: Secondary | ICD-10-CM | POA: Diagnosis not present

## 2021-09-08 DIAGNOSIS — I700 Atherosclerosis of aorta: Secondary | ICD-10-CM | POA: Diagnosis not present

## 2021-09-08 DIAGNOSIS — J449 Chronic obstructive pulmonary disease, unspecified: Secondary | ICD-10-CM | POA: Diagnosis not present

## 2021-09-08 DIAGNOSIS — E118 Type 2 diabetes mellitus with unspecified complications: Secondary | ICD-10-CM | POA: Diagnosis not present

## 2021-09-08 DIAGNOSIS — E785 Hyperlipidemia, unspecified: Secondary | ICD-10-CM | POA: Diagnosis not present

## 2021-09-08 DIAGNOSIS — Z23 Encounter for immunization: Secondary | ICD-10-CM | POA: Diagnosis not present

## 2021-09-21 DIAGNOSIS — I739 Peripheral vascular disease, unspecified: Secondary | ICD-10-CM | POA: Diagnosis not present

## 2021-09-21 DIAGNOSIS — R6889 Other general symptoms and signs: Secondary | ICD-10-CM | POA: Diagnosis not present

## 2021-09-21 DIAGNOSIS — Z6825 Body mass index (BMI) 25.0-25.9, adult: Secondary | ICD-10-CM | POA: Diagnosis not present

## 2021-10-12 DIAGNOSIS — L219 Seborrheic dermatitis, unspecified: Secondary | ICD-10-CM | POA: Diagnosis not present

## 2021-10-12 DIAGNOSIS — L570 Actinic keratosis: Secondary | ICD-10-CM | POA: Diagnosis not present

## 2021-10-12 DIAGNOSIS — L821 Other seborrheic keratosis: Secondary | ICD-10-CM | POA: Diagnosis not present

## 2021-10-19 DIAGNOSIS — M25461 Effusion, right knee: Secondary | ICD-10-CM | POA: Diagnosis not present

## 2021-10-19 DIAGNOSIS — M47816 Spondylosis without myelopathy or radiculopathy, lumbar region: Secondary | ICD-10-CM | POA: Diagnosis not present

## 2021-10-19 DIAGNOSIS — I70292 Other atherosclerosis of native arteries of extremities, left leg: Secondary | ICD-10-CM | POA: Diagnosis not present

## 2021-10-19 DIAGNOSIS — N400 Enlarged prostate without lower urinary tract symptoms: Secondary | ICD-10-CM | POA: Diagnosis not present

## 2021-10-19 DIAGNOSIS — R6889 Other general symptoms and signs: Secondary | ICD-10-CM | POA: Diagnosis not present

## 2021-10-19 DIAGNOSIS — I700 Atherosclerosis of aorta: Secondary | ICD-10-CM | POA: Diagnosis not present

## 2021-10-19 DIAGNOSIS — M25462 Effusion, left knee: Secondary | ICD-10-CM | POA: Diagnosis not present

## 2021-10-19 DIAGNOSIS — I739 Peripheral vascular disease, unspecified: Secondary | ICD-10-CM | POA: Diagnosis not present

## 2021-10-19 DIAGNOSIS — M1612 Unilateral primary osteoarthritis, left hip: Secondary | ICD-10-CM | POA: Diagnosis not present

## 2021-11-03 DIAGNOSIS — I700 Atherosclerosis of aorta: Secondary | ICD-10-CM | POA: Diagnosis not present

## 2021-11-03 DIAGNOSIS — I771 Stricture of artery: Secondary | ICD-10-CM | POA: Diagnosis not present

## 2021-11-03 DIAGNOSIS — I739 Peripheral vascular disease, unspecified: Secondary | ICD-10-CM | POA: Diagnosis not present

## 2021-11-03 DIAGNOSIS — K862 Cyst of pancreas: Secondary | ICD-10-CM | POA: Diagnosis not present

## 2021-11-14 DIAGNOSIS — N401 Enlarged prostate with lower urinary tract symptoms: Secondary | ICD-10-CM | POA: Diagnosis not present

## 2021-11-14 DIAGNOSIS — R972 Elevated prostate specific antigen [PSA]: Secondary | ICD-10-CM | POA: Diagnosis not present

## 2021-11-21 ENCOUNTER — Ambulatory Visit: Payer: PPO | Admitting: Vascular Surgery

## 2021-11-21 ENCOUNTER — Encounter: Payer: Self-pay | Admitting: Vascular Surgery

## 2021-11-21 ENCOUNTER — Other Ambulatory Visit: Payer: Self-pay

## 2021-11-21 DIAGNOSIS — I739 Peripheral vascular disease, unspecified: Secondary | ICD-10-CM | POA: Insufficient documentation
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
## 2021-11-24 ENCOUNTER — Other Ambulatory Visit: Payer: Self-pay

## 2021-11-24 DIAGNOSIS — I739 Peripheral vascular disease, unspecified: Secondary | ICD-10-CM

## 2021-11-28 DIAGNOSIS — L570 Actinic keratosis: Secondary | ICD-10-CM | POA: Diagnosis not present

## 2021-11-28 DIAGNOSIS — L821 Other seborrheic keratosis: Secondary | ICD-10-CM | POA: Diagnosis not present

## 2021-11-28 DIAGNOSIS — L219 Seborrheic dermatitis, unspecified: Secondary | ICD-10-CM | POA: Diagnosis not present

## 2022-01-03 DIAGNOSIS — H2513 Age-related nuclear cataract, bilateral: Secondary | ICD-10-CM | POA: Diagnosis not present

## 2022-01-08 DIAGNOSIS — E118 Type 2 diabetes mellitus with unspecified complications: Secondary | ICD-10-CM | POA: Diagnosis not present

## 2022-01-08 DIAGNOSIS — E785 Hyperlipidemia, unspecified: Secondary | ICD-10-CM | POA: Diagnosis not present

## 2022-01-15 DIAGNOSIS — I700 Atherosclerosis of aorta: Secondary | ICD-10-CM | POA: Diagnosis not present

## 2022-01-15 DIAGNOSIS — E785 Hyperlipidemia, unspecified: Secondary | ICD-10-CM | POA: Diagnosis not present

## 2022-01-15 DIAGNOSIS — J449 Chronic obstructive pulmonary disease, unspecified: Secondary | ICD-10-CM | POA: Diagnosis not present

## 2022-01-15 DIAGNOSIS — E118 Type 2 diabetes mellitus with unspecified complications: Secondary | ICD-10-CM | POA: Diagnosis not present

## 2022-01-15 DIAGNOSIS — Z23 Encounter for immunization: Secondary | ICD-10-CM | POA: Diagnosis not present

## 2022-02-05 DIAGNOSIS — I499 Cardiac arrhythmia, unspecified: Secondary | ICD-10-CM | POA: Diagnosis not present

## 2022-02-05 DIAGNOSIS — N400 Enlarged prostate without lower urinary tract symptoms: Secondary | ICD-10-CM | POA: Diagnosis not present

## 2022-02-05 DIAGNOSIS — I10 Essential (primary) hypertension: Secondary | ICD-10-CM | POA: Diagnosis not present

## 2022-02-05 DIAGNOSIS — Z87891 Personal history of nicotine dependence: Secondary | ICD-10-CM | POA: Diagnosis not present

## 2022-02-05 DIAGNOSIS — E785 Hyperlipidemia, unspecified: Secondary | ICD-10-CM | POA: Diagnosis not present

## 2022-02-05 DIAGNOSIS — G8929 Other chronic pain: Secondary | ICD-10-CM | POA: Diagnosis not present

## 2022-02-05 DIAGNOSIS — E663 Overweight: Secondary | ICD-10-CM | POA: Diagnosis not present

## 2022-02-05 DIAGNOSIS — I739 Peripheral vascular disease, unspecified: Secondary | ICD-10-CM | POA: Diagnosis not present

## 2022-02-15 DIAGNOSIS — R972 Elevated prostate specific antigen [PSA]: Secondary | ICD-10-CM | POA: Diagnosis not present

## 2022-02-21 DIAGNOSIS — Z022 Encounter for examination for admission to residential institution: Secondary | ICD-10-CM | POA: Diagnosis not present

## 2022-02-21 DIAGNOSIS — I10 Essential (primary) hypertension: Secondary | ICD-10-CM | POA: Diagnosis not present

## 2022-02-21 DIAGNOSIS — E118 Type 2 diabetes mellitus with unspecified complications: Secondary | ICD-10-CM | POA: Diagnosis not present

## 2022-02-21 DIAGNOSIS — Z111 Encounter for screening for respiratory tuberculosis: Secondary | ICD-10-CM | POA: Diagnosis not present

## 2022-02-21 DIAGNOSIS — Z6824 Body mass index (BMI) 24.0-24.9, adult: Secondary | ICD-10-CM | POA: Diagnosis not present

## 2022-02-26 DIAGNOSIS — Z20822 Contact with and (suspected) exposure to covid-19: Secondary | ICD-10-CM | POA: Diagnosis not present

## 2022-02-26 DIAGNOSIS — Z1152 Encounter for screening for COVID-19: Secondary | ICD-10-CM | POA: Diagnosis not present

## 2022-02-28 DIAGNOSIS — J449 Chronic obstructive pulmonary disease, unspecified: Secondary | ICD-10-CM | POA: Diagnosis not present

## 2022-02-28 DIAGNOSIS — I10 Essential (primary) hypertension: Secondary | ICD-10-CM | POA: Diagnosis not present

## 2022-02-28 DIAGNOSIS — J8410 Pulmonary fibrosis, unspecified: Secondary | ICD-10-CM | POA: Diagnosis not present

## 2022-02-28 DIAGNOSIS — E119 Type 2 diabetes mellitus without complications: Secondary | ICD-10-CM | POA: Diagnosis not present

## 2022-03-02 DIAGNOSIS — E785 Hyperlipidemia, unspecified: Secondary | ICD-10-CM | POA: Diagnosis not present

## 2022-03-02 DIAGNOSIS — E119 Type 2 diabetes mellitus without complications: Secondary | ICD-10-CM | POA: Diagnosis not present

## 2022-03-02 DIAGNOSIS — E789 Disorder of lipoprotein metabolism, unspecified: Secondary | ICD-10-CM | POA: Diagnosis not present

## 2022-03-02 DIAGNOSIS — E039 Hypothyroidism, unspecified: Secondary | ICD-10-CM | POA: Diagnosis not present

## 2022-03-02 DIAGNOSIS — E559 Vitamin D deficiency, unspecified: Secondary | ICD-10-CM | POA: Diagnosis not present

## 2022-03-02 DIAGNOSIS — D649 Anemia, unspecified: Secondary | ICD-10-CM | POA: Diagnosis not present

## 2022-03-02 DIAGNOSIS — I10 Essential (primary) hypertension: Secondary | ICD-10-CM | POA: Diagnosis not present

## 2022-03-08 ENCOUNTER — Ambulatory Visit: Payer: PPO | Admitting: Podiatry

## 2022-03-08 ENCOUNTER — Encounter: Payer: Self-pay | Admitting: Podiatry

## 2022-03-08 ENCOUNTER — Other Ambulatory Visit: Payer: Self-pay

## 2022-03-08 DIAGNOSIS — M79674 Pain in right toe(s): Secondary | ICD-10-CM

## 2022-03-08 DIAGNOSIS — E119 Type 2 diabetes mellitus without complications: Secondary | ICD-10-CM | POA: Diagnosis not present

## 2022-03-08 DIAGNOSIS — M2012 Hallux valgus (acquired), left foot: Secondary | ICD-10-CM

## 2022-03-08 DIAGNOSIS — B351 Tinea unguium: Secondary | ICD-10-CM

## 2022-03-08 DIAGNOSIS — E1151 Type 2 diabetes mellitus with diabetic peripheral angiopathy without gangrene: Secondary | ICD-10-CM

## 2022-03-08 DIAGNOSIS — M79675 Pain in left toe(s): Secondary | ICD-10-CM | POA: Diagnosis not present

## 2022-03-08 DIAGNOSIS — M2011 Hallux valgus (acquired), right foot: Secondary | ICD-10-CM

## 2022-03-17 DIAGNOSIS — E119 Type 2 diabetes mellitus without complications: Secondary | ICD-10-CM | POA: Insufficient documentation

## 2022-03-17 NOTE — Progress Notes (Signed)
Subjective: ?Edwin Burns presents today referred by Marco Collie, MD for complaint of with chief concern of elongated, thickened, painful, discolored toenails for several months. Patient has tried self attempt at trimming toenails. He is accompanied by his son on today's visit. Wife is also present and has appointment on today's visit.  ? ?Per chart review, he has been diagnosed with PAD which is managed by [facility] [place]. ? ?Past Medical History:  ?Diagnosis Date  ? COPD (chronic obstructive pulmonary disease) ([place])   ? Diabetes ([place])   ? Hyperlipemia   ?  ? ?Patient Active Problem List  ? Diagnosis Date Noted  ? Diabetes ([place])   ? PAD (peripheral artery disease) ([place]) 11/21/2021  ? Postoperative examination 10/08/2016  ? Lipoma of back 09/18/2016  ? Right inguinal hernia 09/18/2016  ? Umbilical hernia without obstruction and without gangrene 09/18/2016  ?  ? ?Past Surgical History:  ?Procedure Laterality Date  ? ELBOW SURGERY    ?  ? ?Current Outpatient Medications on File Prior to Visit  ?Medication Sig Dispense Refill  ? aspirin 81 MG EC tablet Take by mouth.    ? BOOSTRIX 5-2.5-18.5 LF-MCG/0.5 injection     ? cetirizine (ZYRTEC) 10 MG tablet Take by mouth.    ? finasteride (PROSCAR) 5 MG tablet Take by mouth.    ? hydrochlorothiazide (MICROZIDE) 12.5 MG capsule Take 12.5 mg by mouth daily.    ? ketoconazole (NIZORAL) 2 % shampoo Apply topically.    ? neomycin-polymyxin b-dexamethasone (MAXITROL) 3.5-10000-0.1 SUSP 1 drop 4 (four) times daily.    ? nystatin cream (MYCOSTATIN) Apply topically.    ? olmesartan (BENICAR) 20 MG tablet Take 20 mg by mouth daily.    ? olmesartan-hydrochlorothiazide (BENICAR HCT) 40-12.5 MG tablet Take 1 tablet by mouth daily.    ? rosuvastatin (CRESTOR) 5 MG tablet Take 5 mg by mouth daily.    ? tamsulosin (FLOMAX) 0.4 MG CAPS capsule Take 0.8 mg by mouth daily after breakfast.    ? triamcinolone cream (KENALOG) 0.1 % Apply topically.    ? Turmeric  500 MG CAPS Take 1,000 mg by mouth daily.    ? ?No current facility-administered medications on file prior to visit.  ?  ? ?Allergies  ?Allergen Reactions  ? Zolpidem Other (See Comments)  ?  Blacked out ?Hallucinations ?Hallucinations ?  ? Simvastatin Other (See Comments)  ?  LFT Elevation ?LFT Elevation ?  ? Codeine Itching and Rash  ?  ? ?Social History  ? ?Occupational History  ? Not on file  ?Tobacco Use  ? Smoking status: Former  ?  Packs/day: 1.00  ?  Years: 15.00  ?  Pack years: 15.00  ?  Types: Cigarettes  ?  Quit date: 42  ?  Years since quitting: 58.2  ? Smokeless tobacco: Never  ?Substance and Sexual Activity  ? Alcohol use: Never  ? Drug use: Never  ? Sexual activity: Not on file  ?  ? ?History reviewed. No pertinent family history.  ? ?Immunization History  ?Administered Date(s) Administered  ? Influenza, High Dose Seasonal PF 08/25/2019  ?  ? ?Objective: ?[name] is a pleasant 81 y.o. male WD, WN in NAD. AAO x 3. ? ?There were no vitals filed for this visit. ? ?Vascular Examination:  ?CFT immediate b/l LE. Palpable DP/PT pulses b/l LE. Digital hair absent b/l. Skin temperature gradient WNL b/l. No pain with calf compression b/l. No edema noted b/l. No cyanosis or clubbing  noted b/l LE. ? ?Dermatological Examination: ?Pedal skin is warm and supple b/l LE. No open wounds b/l LE. Toenails 1-5 b/l elongated, discolored, dystrophic, thickened, crumbly with subungual debris and tenderness to dorsal palpation. No hyperkeratotic nor porokeratotic lesions present on today's visit. ? ?Musculoskeletal: ?Muscle strength 5/5 to all lower extremity muscle groups bilaterally. HAV with bunion deformity noted b/l LE. ? ?Neurological: ?Protective sensation intact 5/5 intact bilaterally with 10g monofilament b/l. Vibratory sensation intact b/l. ? ?Assessment: ?1. Pain due to onychomycosis of toenails of both feet   ?2. Hallux valgus, acquired, bilateral   ?3. Type II diabetes mellitus with peripheral  circulatory disorder (HCC)   ?4. Encounter for diabetic foot exam ([place])   ?  ?Plan: ?-Patient was evaluated and treated. All patient's and/or POA's questions/concerns answered on today's visit. ?-Patient was adivsed on dangers of trimming his toenails as well as salon pedicures. ?-Diabetic foot examination performed today. ?-Continue foot and shoe inspections daily. Monitor blood glucose per PCP/Endocrinologist's recommendations. ?-Mycotic toenails 1-5 bilaterally were debrided in length and girth with sterile nail nippers and dremel without incident. ?-He is to apply Vick's Vapor Rub to toenails once daily with q-tip. ?-Patient/POA to call should there be question/concern in the interim. ? ?Return in about 3 months (around 06/08/2022). ? ?Marzetta Board, DPM ?

## 2022-05-07 DIAGNOSIS — N401 Enlarged prostate with lower urinary tract symptoms: Secondary | ICD-10-CM | POA: Diagnosis not present

## 2022-05-07 DIAGNOSIS — R972 Elevated prostate specific antigen [PSA]: Secondary | ICD-10-CM | POA: Diagnosis not present

## 2022-05-09 DIAGNOSIS — J449 Chronic obstructive pulmonary disease, unspecified: Secondary | ICD-10-CM | POA: Diagnosis not present

## 2022-05-09 DIAGNOSIS — M1612 Unilateral primary osteoarthritis, left hip: Secondary | ICD-10-CM | POA: Diagnosis not present

## 2022-05-09 DIAGNOSIS — E119 Type 2 diabetes mellitus without complications: Secondary | ICD-10-CM | POA: Diagnosis not present

## 2022-05-09 DIAGNOSIS — I119 Hypertensive heart disease without heart failure: Secondary | ICD-10-CM | POA: Diagnosis not present

## 2022-05-16 DIAGNOSIS — R269 Unspecified abnormalities of gait and mobility: Secondary | ICD-10-CM | POA: Diagnosis not present

## 2022-05-16 DIAGNOSIS — R001 Bradycardia, unspecified: Secondary | ICD-10-CM | POA: Diagnosis not present

## 2022-05-16 DIAGNOSIS — M6281 Muscle weakness (generalized): Secondary | ICD-10-CM | POA: Diagnosis not present

## 2022-05-16 DIAGNOSIS — S51011A Laceration without foreign body of right elbow, initial encounter: Secondary | ICD-10-CM | POA: Diagnosis not present

## 2022-05-16 DIAGNOSIS — F0390 Unspecified dementia without behavioral disturbance: Secondary | ICD-10-CM | POA: Diagnosis not present

## 2022-05-18 DIAGNOSIS — F0390 Unspecified dementia without behavioral disturbance: Secondary | ICD-10-CM | POA: Diagnosis not present

## 2022-05-18 DIAGNOSIS — I119 Hypertensive heart disease without heart failure: Secondary | ICD-10-CM | POA: Diagnosis not present

## 2022-05-18 DIAGNOSIS — S51011A Laceration without foreign body of right elbow, initial encounter: Secondary | ICD-10-CM | POA: Diagnosis not present

## 2022-05-24 DIAGNOSIS — F419 Anxiety disorder, unspecified: Secondary | ICD-10-CM | POA: Diagnosis not present

## 2022-05-24 DIAGNOSIS — R451 Restlessness and agitation: Secondary | ICD-10-CM | POA: Diagnosis not present

## 2022-05-24 DIAGNOSIS — I119 Hypertensive heart disease without heart failure: Secondary | ICD-10-CM | POA: Diagnosis not present

## 2022-05-24 DIAGNOSIS — F0390 Unspecified dementia without behavioral disturbance: Secondary | ICD-10-CM | POA: Diagnosis not present

## 2022-05-25 DIAGNOSIS — I10 Essential (primary) hypertension: Secondary | ICD-10-CM | POA: Diagnosis not present

## 2022-05-25 DIAGNOSIS — E559 Vitamin D deficiency, unspecified: Secondary | ICD-10-CM | POA: Diagnosis not present

## 2022-05-25 DIAGNOSIS — E039 Hypothyroidism, unspecified: Secondary | ICD-10-CM | POA: Diagnosis not present

## 2022-05-25 DIAGNOSIS — E785 Hyperlipidemia, unspecified: Secondary | ICD-10-CM | POA: Diagnosis not present

## 2022-05-25 DIAGNOSIS — E119 Type 2 diabetes mellitus without complications: Secondary | ICD-10-CM | POA: Diagnosis not present

## 2022-05-25 DIAGNOSIS — D649 Anemia, unspecified: Secondary | ICD-10-CM | POA: Diagnosis not present

## 2022-05-30 DIAGNOSIS — R451 Restlessness and agitation: Secondary | ICD-10-CM | POA: Diagnosis not present

## 2022-05-30 DIAGNOSIS — D649 Anemia, unspecified: Secondary | ICD-10-CM | POA: Diagnosis not present

## 2022-05-30 DIAGNOSIS — E8809 Other disorders of plasma-protein metabolism, not elsewhere classified: Secondary | ICD-10-CM | POA: Diagnosis not present

## 2022-05-30 DIAGNOSIS — F03918 Unspecified dementia, unspecified severity, with other behavioral disturbance: Secondary | ICD-10-CM | POA: Diagnosis not present

## 2022-05-30 DIAGNOSIS — F419 Anxiety disorder, unspecified: Secondary | ICD-10-CM | POA: Diagnosis not present

## 2022-05-30 DIAGNOSIS — R799 Abnormal finding of blood chemistry, unspecified: Secondary | ICD-10-CM | POA: Diagnosis not present

## 2022-05-30 DIAGNOSIS — E559 Vitamin D deficiency, unspecified: Secondary | ICD-10-CM | POA: Diagnosis not present

## 2022-06-06 DIAGNOSIS — F419 Anxiety disorder, unspecified: Secondary | ICD-10-CM | POA: Diagnosis not present

## 2022-06-06 DIAGNOSIS — R451 Restlessness and agitation: Secondary | ICD-10-CM | POA: Diagnosis not present

## 2022-06-06 DIAGNOSIS — F0390 Unspecified dementia without behavioral disturbance: Secondary | ICD-10-CM | POA: Diagnosis not present

## 2022-06-06 DIAGNOSIS — F39 Unspecified mood [affective] disorder: Secondary | ICD-10-CM | POA: Diagnosis not present

## 2022-06-12 DIAGNOSIS — F03918 Unspecified dementia, unspecified severity, with other behavioral disturbance: Secondary | ICD-10-CM | POA: Diagnosis not present

## 2022-06-12 DIAGNOSIS — R451 Restlessness and agitation: Secondary | ICD-10-CM | POA: Diagnosis not present

## 2022-06-12 DIAGNOSIS — F419 Anxiety disorder, unspecified: Secondary | ICD-10-CM | POA: Diagnosis not present

## 2022-06-16 DIAGNOSIS — N390 Urinary tract infection, site not specified: Secondary | ICD-10-CM | POA: Diagnosis not present

## 2022-06-20 DIAGNOSIS — F0390 Unspecified dementia without behavioral disturbance: Secondary | ICD-10-CM | POA: Diagnosis not present

## 2022-06-20 DIAGNOSIS — R4182 Altered mental status, unspecified: Secondary | ICD-10-CM | POA: Diagnosis not present

## 2022-06-20 DIAGNOSIS — R451 Restlessness and agitation: Secondary | ICD-10-CM | POA: Diagnosis not present

## 2022-06-21 ENCOUNTER — Encounter: Payer: Self-pay | Admitting: Podiatry

## 2022-06-21 ENCOUNTER — Ambulatory Visit: Payer: PPO | Admitting: Podiatry

## 2022-06-21 DIAGNOSIS — B351 Tinea unguium: Secondary | ICD-10-CM

## 2022-06-21 DIAGNOSIS — M79674 Pain in right toe(s): Secondary | ICD-10-CM | POA: Diagnosis not present

## 2022-06-21 DIAGNOSIS — S61411A Laceration without foreign body of right hand, initial encounter: Secondary | ICD-10-CM | POA: Diagnosis not present

## 2022-06-21 DIAGNOSIS — E1151 Type 2 diabetes mellitus with diabetic peripheral angiopathy without gangrene: Secondary | ICD-10-CM

## 2022-06-21 DIAGNOSIS — M79675 Pain in left toe(s): Secondary | ICD-10-CM | POA: Diagnosis not present

## 2022-06-21 DIAGNOSIS — S81811A Laceration without foreign body, right lower leg, initial encounter: Secondary | ICD-10-CM | POA: Diagnosis not present

## 2022-06-21 DIAGNOSIS — M6281 Muscle weakness (generalized): Secondary | ICD-10-CM | POA: Diagnosis not present

## 2022-06-21 DIAGNOSIS — R269 Unspecified abnormalities of gait and mobility: Secondary | ICD-10-CM | POA: Diagnosis not present

## 2022-06-21 DIAGNOSIS — S41112A Laceration without foreign body of left upper arm, initial encounter: Secondary | ICD-10-CM | POA: Diagnosis not present

## 2022-06-21 NOTE — Progress Notes (Unsigned)
Subjective:  Patient ID: Edwin Burns, male    DOB: 1941/06/19,  MRN: [id number]  Edwin Burns presents to clinic today for {jgcomplaint:23593}  Patient states blood glucose was *** mg/dl {Time; today/yesterday/ 2 days ago:19188} ***.  Last A1c was ***%.   Last known HgA1c was unknown.  Patient did not check blood glucose today.  Patient does not monitor blood glucose daily.  Patient is not required to monitor blood glucose daily.  New problem(s): None. {jgcomplaint:23593}  PCP is Abner Greenspan, MD , and last visit was {Time; dates multiple:304500300}.  Allergies  Allergen Reactions   Zolpidem Other (See Comments)    Blacked out Hallucinations Hallucinations    Simvastatin Other (See Comments)    LFT Elevation LFT Elevation    Codeine Itching and Rash    Review of Systems: Negative except as noted in the HPI.  Objective: No changes noted in today's physical examination.  Edwin Burns is a pleasant 81 y.o. male WD, WN in NAD. AAO x 3.  There were no vitals filed for this visit.  Vascular Examination:  CFT immediate b/l LE. Palpable DP/PT pulses b/l LE. Digital hair absent b/l. Skin temperature gradient WNL b/l. No pain with calf compression b/l. No edema noted b/l. No cyanosis or clubbing noted b/l LE.  Dermatological Examination: Pedal skin is warm and supple b/l LE.  Toenails 1-5 b/l elongated, discolored, dystrophic, thickened, crumbly with subungual debris and tenderness to dorsal palpation. No hyperkeratotic nor porokeratotic lesions present on today's visit.  Musculoskeletal: Muscle strength 5/5 to all lower extremity muscle groups bilaterally. HAV with bunion deformity noted b/l LE.  Neurological: Protective sensation intact 5/5 intact bilaterally with 10g monofilament b/l. Vibratory sensation intact b/l.  Xray findings {jgPodToeLocator:23637}: {jgxrayfindings:23683}      No data to display         Assessment/Plan: No diagnosis found.    {Jgplan:23602::"-Patient/POA to call should there be question/concern in the interim."}   Return in about 3 months (around 09/21/2022).  Freddie Breech, DPM

## 2022-06-27 DIAGNOSIS — F419 Anxiety disorder, unspecified: Secondary | ICD-10-CM | POA: Diagnosis not present

## 2022-06-27 DIAGNOSIS — F03918 Unspecified dementia, unspecified severity, with other behavioral disturbance: Secondary | ICD-10-CM | POA: Diagnosis not present

## 2022-06-27 DIAGNOSIS — R451 Restlessness and agitation: Secondary | ICD-10-CM | POA: Diagnosis not present

## 2022-07-02 DIAGNOSIS — F0390 Unspecified dementia without behavioral disturbance: Secondary | ICD-10-CM | POA: Diagnosis not present

## 2022-07-02 DIAGNOSIS — R21 Rash and other nonspecific skin eruption: Secondary | ICD-10-CM | POA: Diagnosis not present

## 2022-07-02 DIAGNOSIS — L853 Xerosis cutis: Secondary | ICD-10-CM | POA: Diagnosis not present

## 2022-07-04 DIAGNOSIS — R4182 Altered mental status, unspecified: Secondary | ICD-10-CM | POA: Diagnosis not present

## 2022-07-04 DIAGNOSIS — J9601 Acute respiratory failure with hypoxia: Secondary | ICD-10-CM | POA: Diagnosis not present

## 2022-07-04 DIAGNOSIS — M25552 Pain in left hip: Secondary | ICD-10-CM | POA: Diagnosis not present

## 2022-07-04 DIAGNOSIS — E8809 Other disorders of plasma-protein metabolism, not elsewhere classified: Secondary | ICD-10-CM | POA: Diagnosis not present

## 2022-07-04 DIAGNOSIS — I361 Nonrheumatic tricuspid (valve) insufficiency: Secondary | ICD-10-CM | POA: Diagnosis not present

## 2022-07-04 DIAGNOSIS — R0902 Hypoxemia: Secondary | ICD-10-CM | POA: Diagnosis not present

## 2022-07-04 DIAGNOSIS — W19XXXA Unspecified fall, initial encounter: Secondary | ICD-10-CM | POA: Diagnosis not present

## 2022-07-04 DIAGNOSIS — F0390 Unspecified dementia without behavioral disturbance: Secondary | ICD-10-CM | POA: Diagnosis not present

## 2022-07-04 DIAGNOSIS — S7700XA Crushing injury of unspecified hip, initial encounter: Secondary | ICD-10-CM | POA: Diagnosis not present

## 2022-07-04 DIAGNOSIS — A419 Sepsis, unspecified organism: Secondary | ICD-10-CM | POA: Diagnosis not present

## 2022-07-04 DIAGNOSIS — I10 Essential (primary) hypertension: Secondary | ICD-10-CM | POA: Diagnosis not present

## 2022-07-04 DIAGNOSIS — Z79899 Other long term (current) drug therapy: Secondary | ICD-10-CM | POA: Diagnosis not present

## 2022-07-04 DIAGNOSIS — R55 Syncope and collapse: Secondary | ICD-10-CM | POA: Diagnosis not present

## 2022-07-04 DIAGNOSIS — I672 Cerebral atherosclerosis: Secondary | ICD-10-CM | POA: Diagnosis not present

## 2022-07-04 DIAGNOSIS — J449 Chronic obstructive pulmonary disease, unspecified: Secondary | ICD-10-CM | POA: Diagnosis not present

## 2022-07-04 DIAGNOSIS — Z20822 Contact with and (suspected) exposure to covid-19: Secondary | ICD-10-CM | POA: Diagnosis not present

## 2022-07-04 DIAGNOSIS — I482 Chronic atrial fibrillation, unspecified: Secondary | ICD-10-CM | POA: Diagnosis not present

## 2022-07-04 DIAGNOSIS — R410 Disorientation, unspecified: Secondary | ICD-10-CM | POA: Diagnosis not present

## 2022-07-04 DIAGNOSIS — G9389 Other specified disorders of brain: Secondary | ICD-10-CM | POA: Diagnosis not present

## 2022-07-04 DIAGNOSIS — I4891 Unspecified atrial fibrillation: Secondary | ICD-10-CM | POA: Diagnosis not present

## 2022-07-04 DIAGNOSIS — Z043 Encounter for examination and observation following other accident: Secondary | ICD-10-CM | POA: Diagnosis not present

## 2022-07-04 DIAGNOSIS — R918 Other nonspecific abnormal finding of lung field: Secondary | ICD-10-CM | POA: Diagnosis not present

## 2022-07-04 DIAGNOSIS — Z87891 Personal history of nicotine dependence: Secondary | ICD-10-CM | POA: Diagnosis not present

## 2022-07-04 DIAGNOSIS — Z792 Long term (current) use of antibiotics: Secondary | ICD-10-CM | POA: Diagnosis not present

## 2022-07-04 DIAGNOSIS — Z888 Allergy status to other drugs, medicaments and biological substances status: Secondary | ICD-10-CM | POA: Diagnosis not present

## 2022-07-04 DIAGNOSIS — M79652 Pain in left thigh: Secondary | ICD-10-CM | POA: Diagnosis not present

## 2022-07-04 DIAGNOSIS — M1990 Unspecified osteoarthritis, unspecified site: Secondary | ICD-10-CM | POA: Diagnosis not present

## 2022-07-05 DIAGNOSIS — Z79899 Other long term (current) drug therapy: Secondary | ICD-10-CM | POA: Diagnosis not present

## 2022-07-05 DIAGNOSIS — R0902 Hypoxemia: Secondary | ICD-10-CM | POA: Diagnosis not present

## 2022-07-05 DIAGNOSIS — E8809 Other disorders of plasma-protein metabolism, not elsewhere classified: Secondary | ICD-10-CM | POA: Diagnosis not present

## 2022-07-05 DIAGNOSIS — Z20822 Contact with and (suspected) exposure to covid-19: Secondary | ICD-10-CM | POA: Diagnosis not present

## 2022-07-05 DIAGNOSIS — Z87891 Personal history of nicotine dependence: Secondary | ICD-10-CM | POA: Diagnosis not present

## 2022-07-05 DIAGNOSIS — I482 Chronic atrial fibrillation, unspecified: Secondary | ICD-10-CM | POA: Diagnosis not present

## 2022-07-05 DIAGNOSIS — I672 Cerebral atherosclerosis: Secondary | ICD-10-CM | POA: Diagnosis not present

## 2022-07-05 DIAGNOSIS — G9389 Other specified disorders of brain: Secondary | ICD-10-CM | POA: Diagnosis not present

## 2022-07-05 DIAGNOSIS — J449 Chronic obstructive pulmonary disease, unspecified: Secondary | ICD-10-CM | POA: Diagnosis not present

## 2022-07-05 DIAGNOSIS — F0390 Unspecified dementia without behavioral disturbance: Secondary | ICD-10-CM | POA: Diagnosis not present

## 2022-07-05 DIAGNOSIS — Z888 Allergy status to other drugs, medicaments and biological substances status: Secondary | ICD-10-CM | POA: Diagnosis not present

## 2022-07-05 DIAGNOSIS — Z792 Long term (current) use of antibiotics: Secondary | ICD-10-CM | POA: Diagnosis not present

## 2022-07-05 DIAGNOSIS — I4891 Unspecified atrial fibrillation: Secondary | ICD-10-CM | POA: Diagnosis not present

## 2022-07-05 DIAGNOSIS — R55 Syncope and collapse: Secondary | ICD-10-CM | POA: Diagnosis not present

## 2022-07-05 DIAGNOSIS — R4182 Altered mental status, unspecified: Secondary | ICD-10-CM | POA: Diagnosis not present

## 2022-07-05 DIAGNOSIS — R509 Fever, unspecified: Secondary | ICD-10-CM | POA: Diagnosis not present

## 2022-07-05 DIAGNOSIS — R404 Transient alteration of awareness: Secondary | ICD-10-CM | POA: Diagnosis not present

## 2022-07-05 DIAGNOSIS — R451 Restlessness and agitation: Secondary | ICD-10-CM | POA: Diagnosis not present

## 2022-07-05 DIAGNOSIS — M1990 Unspecified osteoarthritis, unspecified site: Secondary | ICD-10-CM | POA: Diagnosis not present

## 2022-07-05 DIAGNOSIS — J9601 Acute respiratory failure with hypoxia: Secondary | ICD-10-CM | POA: Diagnosis not present

## 2022-07-05 DIAGNOSIS — R400 Somnolence: Secondary | ICD-10-CM | POA: Diagnosis not present

## 2022-07-05 DIAGNOSIS — R4020 Unspecified coma: Secondary | ICD-10-CM | POA: Diagnosis not present

## 2022-07-05 DIAGNOSIS — R918 Other nonspecific abnormal finding of lung field: Secondary | ICD-10-CM | POA: Diagnosis not present

## 2022-07-05 DIAGNOSIS — I10 Essential (primary) hypertension: Secondary | ICD-10-CM | POA: Diagnosis not present

## 2022-07-06 DIAGNOSIS — R4182 Altered mental status, unspecified: Secondary | ICD-10-CM | POA: Diagnosis not present

## 2022-07-06 DIAGNOSIS — J9601 Acute respiratory failure with hypoxia: Secondary | ICD-10-CM | POA: Diagnosis not present

## 2022-07-06 DIAGNOSIS — F0390 Unspecified dementia without behavioral disturbance: Secondary | ICD-10-CM | POA: Diagnosis not present

## 2022-07-09 DIAGNOSIS — R269 Unspecified abnormalities of gait and mobility: Secondary | ICD-10-CM | POA: Diagnosis not present

## 2022-07-09 DIAGNOSIS — S41112A Laceration without foreign body of left upper arm, initial encounter: Secondary | ICD-10-CM | POA: Diagnosis not present

## 2022-07-09 DIAGNOSIS — R2689 Other abnormalities of gait and mobility: Secondary | ICD-10-CM | POA: Diagnosis not present

## 2022-07-09 DIAGNOSIS — M6281 Muscle weakness (generalized): Secondary | ICD-10-CM | POA: Diagnosis not present

## 2022-07-10 DIAGNOSIS — F03918 Unspecified dementia, unspecified severity, with other behavioral disturbance: Secondary | ICD-10-CM | POA: Diagnosis not present

## 2022-07-10 DIAGNOSIS — F419 Anxiety disorder, unspecified: Secondary | ICD-10-CM | POA: Diagnosis not present

## 2022-07-10 DIAGNOSIS — R451 Restlessness and agitation: Secondary | ICD-10-CM | POA: Diagnosis not present

## 2022-07-11 DIAGNOSIS — I4891 Unspecified atrial fibrillation: Secondary | ICD-10-CM | POA: Diagnosis not present

## 2022-07-11 DIAGNOSIS — M6281 Muscle weakness (generalized): Secondary | ICD-10-CM | POA: Diagnosis not present

## 2022-07-11 DIAGNOSIS — R600 Localized edema: Secondary | ICD-10-CM | POA: Diagnosis not present

## 2022-07-11 DIAGNOSIS — I959 Hypotension, unspecified: Secondary | ICD-10-CM | POA: Diagnosis not present

## 2022-07-16 DIAGNOSIS — M1612 Unilateral primary osteoarthritis, left hip: Secondary | ICD-10-CM | POA: Diagnosis not present

## 2022-07-16 DIAGNOSIS — F0390 Unspecified dementia without behavioral disturbance: Secondary | ICD-10-CM | POA: Diagnosis not present

## 2022-07-16 DIAGNOSIS — I4891 Unspecified atrial fibrillation: Secondary | ICD-10-CM | POA: Diagnosis not present

## 2022-07-16 DIAGNOSIS — Z9181 History of falling: Secondary | ICD-10-CM | POA: Diagnosis not present

## 2022-07-16 DIAGNOSIS — I700 Atherosclerosis of aorta: Secondary | ICD-10-CM | POA: Diagnosis not present

## 2022-07-16 DIAGNOSIS — I745 Embolism and thrombosis of iliac artery: Secondary | ICD-10-CM | POA: Diagnosis not present

## 2022-07-16 DIAGNOSIS — I959 Hypotension, unspecified: Secondary | ICD-10-CM | POA: Diagnosis not present

## 2022-07-16 DIAGNOSIS — Z87891 Personal history of nicotine dependence: Secondary | ICD-10-CM | POA: Diagnosis not present

## 2022-07-16 DIAGNOSIS — Z87442 Personal history of urinary calculi: Secondary | ICD-10-CM | POA: Diagnosis not present

## 2022-07-16 DIAGNOSIS — Z9049 Acquired absence of other specified parts of digestive tract: Secondary | ICD-10-CM | POA: Diagnosis not present

## 2022-07-16 DIAGNOSIS — E119 Type 2 diabetes mellitus without complications: Secondary | ICD-10-CM | POA: Diagnosis not present

## 2022-07-16 DIAGNOSIS — I10 Essential (primary) hypertension: Secondary | ICD-10-CM | POA: Diagnosis not present

## 2022-07-16 DIAGNOSIS — J449 Chronic obstructive pulmonary disease, unspecified: Secondary | ICD-10-CM | POA: Diagnosis not present

## 2022-07-16 DIAGNOSIS — Z8659 Personal history of other mental and behavioral disorders: Secondary | ICD-10-CM | POA: Diagnosis not present

## 2022-07-16 DIAGNOSIS — E785 Hyperlipidemia, unspecified: Secondary | ICD-10-CM | POA: Diagnosis not present

## 2022-07-16 DIAGNOSIS — J84112 Idiopathic pulmonary fibrosis: Secondary | ICD-10-CM | POA: Diagnosis not present

## 2022-07-24 DIAGNOSIS — M48061 Spinal stenosis, lumbar region without neurogenic claudication: Secondary | ICD-10-CM | POA: Diagnosis not present

## 2022-07-24 DIAGNOSIS — I700 Atherosclerosis of aorta: Secondary | ICD-10-CM | POA: Diagnosis not present

## 2022-07-24 DIAGNOSIS — S0990XA Unspecified injury of head, initial encounter: Secondary | ICD-10-CM | POA: Diagnosis not present

## 2022-07-24 DIAGNOSIS — I6782 Cerebral ischemia: Secondary | ICD-10-CM | POA: Diagnosis not present

## 2022-07-24 DIAGNOSIS — S0101XA Laceration without foreign body of scalp, initial encounter: Secondary | ICD-10-CM | POA: Diagnosis not present

## 2022-07-24 DIAGNOSIS — R109 Unspecified abdominal pain: Secondary | ICD-10-CM | POA: Diagnosis not present

## 2022-07-24 DIAGNOSIS — S199XXA Unspecified injury of neck, initial encounter: Secondary | ICD-10-CM | POA: Diagnosis not present

## 2022-07-24 DIAGNOSIS — R600 Localized edema: Secondary | ICD-10-CM | POA: Diagnosis not present

## 2022-07-24 DIAGNOSIS — N200 Calculus of kidney: Secondary | ICD-10-CM | POA: Diagnosis not present

## 2022-07-24 DIAGNOSIS — Z043 Encounter for examination and observation following other accident: Secondary | ICD-10-CM | POA: Diagnosis not present

## 2022-07-24 DIAGNOSIS — M47812 Spondylosis without myelopathy or radiculopathy, cervical region: Secondary | ICD-10-CM | POA: Diagnosis not present

## 2022-07-24 DIAGNOSIS — Z23 Encounter for immunization: Secondary | ICD-10-CM | POA: Diagnosis not present

## 2022-07-24 DIAGNOSIS — G319 Degenerative disease of nervous system, unspecified: Secondary | ICD-10-CM | POA: Diagnosis not present

## 2022-07-24 DIAGNOSIS — S32010A Wedge compression fracture of first lumbar vertebra, initial encounter for closed fracture: Secondary | ICD-10-CM | POA: Diagnosis not present

## 2022-07-24 DIAGNOSIS — W19XXXA Unspecified fall, initial encounter: Secondary | ICD-10-CM | POA: Diagnosis not present

## 2022-07-24 DIAGNOSIS — N400 Enlarged prostate without lower urinary tract symptoms: Secondary | ICD-10-CM | POA: Diagnosis not present

## 2022-07-25 DIAGNOSIS — M4800 Spinal stenosis, site unspecified: Secondary | ICD-10-CM | POA: Diagnosis not present

## 2022-07-25 DIAGNOSIS — R451 Restlessness and agitation: Secondary | ICD-10-CM | POA: Diagnosis not present

## 2022-07-25 DIAGNOSIS — S0101XA Laceration without foreign body of scalp, initial encounter: Secondary | ICD-10-CM | POA: Diagnosis not present

## 2022-07-25 DIAGNOSIS — F419 Anxiety disorder, unspecified: Secondary | ICD-10-CM | POA: Diagnosis not present

## 2022-07-25 DIAGNOSIS — F0390 Unspecified dementia without behavioral disturbance: Secondary | ICD-10-CM | POA: Diagnosis not present

## 2022-07-25 DIAGNOSIS — F03918 Unspecified dementia, unspecified severity, with other behavioral disturbance: Secondary | ICD-10-CM | POA: Diagnosis not present

## 2022-07-25 DIAGNOSIS — S32000A Wedge compression fracture of unspecified lumbar vertebra, initial encounter for closed fracture: Secondary | ICD-10-CM | POA: Diagnosis not present

## 2022-07-26 ENCOUNTER — Other Ambulatory Visit: Payer: Self-pay | Admitting: Neurosurgery

## 2022-07-26 ENCOUNTER — Other Ambulatory Visit (HOSPITAL_COMMUNITY): Payer: Self-pay | Admitting: Neurosurgery

## 2022-07-26 DIAGNOSIS — M48062 Spinal stenosis, lumbar region with neurogenic claudication: Secondary | ICD-10-CM

## 2022-07-26 DIAGNOSIS — Z6825 Body mass index (BMI) 25.0-25.9, adult: Secondary | ICD-10-CM | POA: Diagnosis not present

## 2022-07-30 DIAGNOSIS — S61412A Laceration without foreign body of left hand, initial encounter: Secondary | ICD-10-CM | POA: Diagnosis not present

## 2022-07-30 DIAGNOSIS — S00411A Abrasion of right ear, initial encounter: Secondary | ICD-10-CM | POA: Diagnosis not present

## 2022-07-30 DIAGNOSIS — R269 Unspecified abnormalities of gait and mobility: Secondary | ICD-10-CM | POA: Diagnosis not present

## 2022-07-30 DIAGNOSIS — M6281 Muscle weakness (generalized): Secondary | ICD-10-CM | POA: Diagnosis not present

## 2022-08-02 DIAGNOSIS — M6281 Muscle weakness (generalized): Secondary | ICD-10-CM | POA: Diagnosis not present

## 2022-08-02 DIAGNOSIS — D649 Anemia, unspecified: Secondary | ICD-10-CM | POA: Diagnosis not present

## 2022-08-02 DIAGNOSIS — R531 Weakness: Secondary | ICD-10-CM | POA: Diagnosis not present

## 2022-08-02 DIAGNOSIS — R0902 Hypoxemia: Secondary | ICD-10-CM | POA: Diagnosis not present

## 2022-08-02 DIAGNOSIS — I10 Essential (primary) hypertension: Secondary | ICD-10-CM | POA: Diagnosis not present

## 2022-08-08 ENCOUNTER — Ambulatory Visit (HOSPITAL_COMMUNITY)
Admission: RE | Admit: 2022-08-08 | Discharge: 2022-08-08 | Disposition: A | Discharge status: Home / Self Care | Payer: PPO | Source: Ambulatory Visit | Attending: Neurosurgery | Admitting: Neurosurgery

## 2022-08-08 DIAGNOSIS — M5126 Other intervertebral disc displacement, lumbar region: Secondary | ICD-10-CM | POA: Diagnosis not present

## 2022-08-08 DIAGNOSIS — M47816 Spondylosis without myelopathy or radiculopathy, lumbar region: Secondary | ICD-10-CM | POA: Diagnosis not present

## 2022-08-08 DIAGNOSIS — M48062 Spinal stenosis, lumbar region with neurogenic claudication: Secondary | ICD-10-CM | POA: Diagnosis not present

## 2022-08-08 DIAGNOSIS — M48061 Spinal stenosis, lumbar region without neurogenic claudication: Secondary | ICD-10-CM | POA: Diagnosis not present

## 2022-08-09 DIAGNOSIS — M48062 Spinal stenosis, lumbar region with neurogenic claudication: Secondary | ICD-10-CM | POA: Diagnosis not present

## 2022-08-13 DIAGNOSIS — L89610 Pressure ulcer of right heel, unstageable: Secondary | ICD-10-CM | POA: Diagnosis not present

## 2022-08-13 DIAGNOSIS — R600 Localized edema: Secondary | ICD-10-CM | POA: Diagnosis not present

## 2022-08-13 DIAGNOSIS — F0390 Unspecified dementia without behavioral disturbance: Secondary | ICD-10-CM | POA: Diagnosis not present

## 2022-08-14 DIAGNOSIS — M48062 Spinal stenosis, lumbar region with neurogenic claudication: Secondary | ICD-10-CM | POA: Diagnosis not present

## 2022-08-14 DIAGNOSIS — M5416 Radiculopathy, lumbar region: Secondary | ICD-10-CM | POA: Diagnosis not present

## 2022-08-15 DIAGNOSIS — Z8659 Personal history of other mental and behavioral disorders: Secondary | ICD-10-CM | POA: Diagnosis not present

## 2022-08-15 DIAGNOSIS — I10 Essential (primary) hypertension: Secondary | ICD-10-CM | POA: Diagnosis not present

## 2022-08-15 DIAGNOSIS — E785 Hyperlipidemia, unspecified: Secondary | ICD-10-CM | POA: Diagnosis not present

## 2022-08-15 DIAGNOSIS — I700 Atherosclerosis of aorta: Secondary | ICD-10-CM | POA: Diagnosis not present

## 2022-08-15 DIAGNOSIS — M1612 Unilateral primary osteoarthritis, left hip: Secondary | ICD-10-CM | POA: Diagnosis not present

## 2022-08-15 DIAGNOSIS — Z9181 History of falling: Secondary | ICD-10-CM | POA: Diagnosis not present

## 2022-08-15 DIAGNOSIS — Z9049 Acquired absence of other specified parts of digestive tract: Secondary | ICD-10-CM | POA: Diagnosis not present

## 2022-08-15 DIAGNOSIS — Z87891 Personal history of nicotine dependence: Secondary | ICD-10-CM | POA: Diagnosis not present

## 2022-08-15 DIAGNOSIS — E119 Type 2 diabetes mellitus without complications: Secondary | ICD-10-CM | POA: Diagnosis not present

## 2022-08-15 DIAGNOSIS — J84112 Idiopathic pulmonary fibrosis: Secondary | ICD-10-CM | POA: Diagnosis not present

## 2022-08-15 DIAGNOSIS — I745 Embolism and thrombosis of iliac artery: Secondary | ICD-10-CM | POA: Diagnosis not present

## 2022-08-15 DIAGNOSIS — I959 Hypotension, unspecified: Secondary | ICD-10-CM | POA: Diagnosis not present

## 2022-08-15 DIAGNOSIS — I4891 Unspecified atrial fibrillation: Secondary | ICD-10-CM | POA: Diagnosis not present

## 2022-08-15 DIAGNOSIS — Z87442 Personal history of urinary calculi: Secondary | ICD-10-CM | POA: Diagnosis not present

## 2022-08-15 DIAGNOSIS — J449 Chronic obstructive pulmonary disease, unspecified: Secondary | ICD-10-CM | POA: Diagnosis not present

## 2022-08-15 DIAGNOSIS — F0390 Unspecified dementia without behavioral disturbance: Secondary | ICD-10-CM | POA: Diagnosis not present

## 2022-08-17 DIAGNOSIS — S91301A Unspecified open wound, right foot, initial encounter: Secondary | ICD-10-CM | POA: Diagnosis not present

## 2022-08-17 DIAGNOSIS — R600 Localized edema: Secondary | ICD-10-CM | POA: Diagnosis not present

## 2022-08-17 DIAGNOSIS — E11621 Type 2 diabetes mellitus with foot ulcer: Secondary | ICD-10-CM | POA: Diagnosis not present

## 2022-08-17 DIAGNOSIS — F0390 Unspecified dementia without behavioral disturbance: Secondary | ICD-10-CM | POA: Diagnosis not present

## 2022-08-24 DIAGNOSIS — R269 Unspecified abnormalities of gait and mobility: Secondary | ICD-10-CM | POA: Diagnosis not present

## 2022-08-24 DIAGNOSIS — F0390 Unspecified dementia without behavioral disturbance: Secondary | ICD-10-CM | POA: Diagnosis not present

## 2022-08-24 DIAGNOSIS — R600 Localized edema: Secondary | ICD-10-CM | POA: Diagnosis not present

## 2022-08-24 DIAGNOSIS — M6281 Muscle weakness (generalized): Secondary | ICD-10-CM | POA: Diagnosis not present

## 2022-08-27 DIAGNOSIS — W19XXXA Unspecified fall, initial encounter: Secondary | ICD-10-CM | POA: Diagnosis not present

## 2022-08-27 DIAGNOSIS — R9431 Abnormal electrocardiogram [ECG] [EKG]: Secondary | ICD-10-CM | POA: Diagnosis not present

## 2022-08-27 DIAGNOSIS — R42 Dizziness and giddiness: Secondary | ICD-10-CM | POA: Diagnosis not present

## 2022-08-27 DIAGNOSIS — R55 Syncope and collapse: Secondary | ICD-10-CM | POA: Diagnosis not present

## 2022-08-27 DIAGNOSIS — Z87891 Personal history of nicotine dependence: Secondary | ICD-10-CM | POA: Diagnosis not present

## 2022-08-27 DIAGNOSIS — S0003XA Contusion of scalp, initial encounter: Secondary | ICD-10-CM | POA: Diagnosis not present

## 2022-08-27 DIAGNOSIS — F0390 Unspecified dementia without behavioral disturbance: Secondary | ICD-10-CM | POA: Diagnosis not present

## 2022-08-27 DIAGNOSIS — S0181XA Laceration without foreign body of other part of head, initial encounter: Secondary | ICD-10-CM | POA: Diagnosis not present

## 2022-08-29 DIAGNOSIS — F03918 Unspecified dementia, unspecified severity, with other behavioral disturbance: Secondary | ICD-10-CM | POA: Diagnosis not present

## 2022-08-29 DIAGNOSIS — F419 Anxiety disorder, unspecified: Secondary | ICD-10-CM | POA: Diagnosis not present

## 2022-08-29 DIAGNOSIS — R451 Restlessness and agitation: Secondary | ICD-10-CM | POA: Diagnosis not present

## 2022-08-30 ENCOUNTER — Telehealth: Payer: Self-pay

## 2022-08-30 DIAGNOSIS — R2689 Other abnormalities of gait and mobility: Secondary | ICD-10-CM | POA: Diagnosis not present

## 2022-08-30 DIAGNOSIS — S0181XA Laceration without foreign body of other part of head, initial encounter: Secondary | ICD-10-CM | POA: Diagnosis not present

## 2022-08-30 DIAGNOSIS — R269 Unspecified abnormalities of gait and mobility: Secondary | ICD-10-CM | POA: Diagnosis not present

## 2022-08-30 DIAGNOSIS — M6281 Muscle weakness (generalized): Secondary | ICD-10-CM | POA: Diagnosis not present

## 2022-08-30 NOTE — Telephone Encounter (Signed)
     Patient  visit on 9/4  at [facility] ED   Have you been able to follow up with your primary care physician? YES The patient was or was not able to obtain any needed medicine or equipment.YES  Are there diet recommendations that you are having difficulty following?NA  Patient expresses understanding of discharge instructions and education provided has no other needs at this time. [place], [facility], Care Management  [phone] [address], [place], [place] [address] Phone: [phone] Email: [email or web address]

## 2022-09-05 DIAGNOSIS — R296 Repeated falls: Secondary | ICD-10-CM | POA: Diagnosis not present

## 2022-09-05 DIAGNOSIS — N390 Urinary tract infection, site not specified: Secondary | ICD-10-CM | POA: Diagnosis not present

## 2022-09-05 DIAGNOSIS — F0394 Unspecified dementia, unspecified severity, with anxiety: Secondary | ICD-10-CM | POA: Diagnosis not present

## 2022-09-05 DIAGNOSIS — N401 Enlarged prostate with lower urinary tract symptoms: Secondary | ICD-10-CM | POA: Diagnosis not present

## 2022-09-05 DIAGNOSIS — R972 Elevated prostate specific antigen [PSA]: Secondary | ICD-10-CM | POA: Diagnosis not present

## 2022-09-05 DIAGNOSIS — F03918 Unspecified dementia, unspecified severity, with other behavioral disturbance: Secondary | ICD-10-CM | POA: Diagnosis not present

## 2022-09-05 DIAGNOSIS — F03911 Unspecified dementia, unspecified severity, with agitation: Secondary | ICD-10-CM | POA: Diagnosis not present

## 2022-09-13 DIAGNOSIS — M25552 Pain in left hip: Secondary | ICD-10-CM | POA: Diagnosis not present

## 2022-09-13 DIAGNOSIS — M1612 Unilateral primary osteoarthritis, left hip: Secondary | ICD-10-CM | POA: Diagnosis not present

## 2022-09-13 DIAGNOSIS — R269 Unspecified abnormalities of gait and mobility: Secondary | ICD-10-CM | POA: Diagnosis not present

## 2022-09-13 DIAGNOSIS — W19XXXA Unspecified fall, initial encounter: Secondary | ICD-10-CM | POA: Diagnosis not present

## 2022-09-13 DIAGNOSIS — M6281 Muscle weakness (generalized): Secondary | ICD-10-CM | POA: Diagnosis not present

## 2022-09-13 DIAGNOSIS — R2689 Other abnormalities of gait and mobility: Secondary | ICD-10-CM | POA: Diagnosis not present

## 2022-09-21 DIAGNOSIS — F0390 Unspecified dementia without behavioral disturbance: Secondary | ICD-10-CM | POA: Diagnosis not present

## 2022-09-21 DIAGNOSIS — Z5181 Encounter for therapeutic drug level monitoring: Secondary | ICD-10-CM | POA: Diagnosis not present

## 2022-09-21 DIAGNOSIS — G4700 Insomnia, unspecified: Secondary | ICD-10-CM | POA: Diagnosis not present

## 2022-09-23 ENCOUNTER — Emergency Department (HOSPITAL_COMMUNITY): Payer: PPO

## 2022-09-23 ENCOUNTER — Encounter (HOSPITAL_COMMUNITY): Payer: Self-pay | Admitting: Emergency Medicine

## 2022-09-23 ENCOUNTER — Other Ambulatory Visit: Payer: Self-pay

## 2022-09-23 ENCOUNTER — Emergency Department (HOSPITAL_COMMUNITY)
Admission: EM | Admit: 2022-09-23 | Discharge: 2022-09-23 | Disposition: A | Discharge status: Skilled Nursing Facility | Payer: PPO | Attending: Emergency Medicine | Admitting: Emergency Medicine

## 2022-09-23 DIAGNOSIS — I959 Hypotension, unspecified: Secondary | ICD-10-CM | POA: Diagnosis not present

## 2022-09-23 DIAGNOSIS — R29818 Other symptoms and signs involving the nervous system: Secondary | ICD-10-CM | POA: Diagnosis not present

## 2022-09-23 DIAGNOSIS — R531 Weakness: Secondary | ICD-10-CM | POA: Diagnosis not present

## 2022-09-23 DIAGNOSIS — M545 Low back pain, unspecified: Secondary | ICD-10-CM | POA: Diagnosis not present

## 2022-09-23 DIAGNOSIS — E119 Type 2 diabetes mellitus without complications: Secondary | ICD-10-CM | POA: Diagnosis not present

## 2022-09-23 DIAGNOSIS — F0390 Unspecified dementia without behavioral disturbance: Secondary | ICD-10-CM | POA: Insufficient documentation

## 2022-09-23 DIAGNOSIS — R079 Chest pain, unspecified: Secondary | ICD-10-CM | POA: Insufficient documentation

## 2022-09-23 DIAGNOSIS — R0789 Other chest pain: Secondary | ICD-10-CM | POA: Diagnosis not present

## 2022-09-23 DIAGNOSIS — J449 Chronic obstructive pulmonary disease, unspecified: Secondary | ICD-10-CM | POA: Insufficient documentation

## 2022-09-23 DIAGNOSIS — Z7982 Long term (current) use of aspirin: Secondary | ICD-10-CM | POA: Diagnosis not present

## 2022-09-23 DIAGNOSIS — R29898 Other symptoms and signs involving the musculoskeletal system: Secondary | ICD-10-CM

## 2022-09-23 DIAGNOSIS — R61 Generalized hyperhidrosis: Secondary | ICD-10-CM | POA: Insufficient documentation

## 2022-09-23 DIAGNOSIS — M40204 Unspecified kyphosis, thoracic region: Secondary | ICD-10-CM | POA: Diagnosis not present

## 2022-09-23 DIAGNOSIS — G319 Degenerative disease of nervous system, unspecified: Secondary | ICD-10-CM | POA: Diagnosis not present

## 2022-09-23 DIAGNOSIS — I6782 Cerebral ischemia: Secondary | ICD-10-CM | POA: Diagnosis not present

## 2022-09-23 LAB — BASIC METABOLIC PANEL
Anion gap: 11 (ref 5–15)
BUN: 22 mg/dL (ref 8–23)
CO2: 25 mmol/L (ref 22–32)
Calcium: 9.2 mg/dL (ref 8.9–10.3)
Chloride: 105 mmol/L (ref 98–111)
Creatinine, Ser: 0.96 mg/dL (ref 0.61–1.24)
GFR, Estimated: >60 mL/min (ref >60)
Glucose, Bld: 102 mg/dL — ABNORMAL HIGH (ref 70–99)
Potassium: 4 mmol/L (ref 3.5–5.1)
Sodium: 141 mmol/L (ref 135–145)

## 2022-09-23 LAB — CBC WITH DIFFERENTIAL/PLATELET
Abs Immature Granulocytes: 0.04 10*3/uL (ref 0.00–0.07)
Basophils Absolute: 0 10*3/uL (ref 0.0–0.1)
Basophils Relative: 0 %
Eosinophils Absolute: 0 10*3/uL (ref 0.0–0.5)
Eosinophils Relative: 0 %
HCT: 40.3 % (ref 39.0–52.0)
Hemoglobin: 13.2 g/dL (ref 13.0–17.0)
Immature Granulocytes: 0 %
Lymphocytes Relative: 17 %
Lymphs Abs: 1.6 10*3/uL (ref 0.7–4.0)
MCH: 29.8 pg (ref 26.0–34.0)
MCHC: 32.8 g/dL (ref 30.0–36.0)
MCV: 91 fL (ref 80.0–100.0)
Monocytes Absolute: 0.9 10*3/uL (ref 0.1–1.0)
Monocytes Relative: 10 %
Neutro Abs: 6.6 10*3/uL (ref 1.7–7.7)
Neutrophils Relative %: 73 %
Platelets: 220 10*3/uL (ref 150–400)
RBC: 4.43 MIL/uL (ref 4.22–5.81)
RDW: 15 % (ref 11.5–15.5)
WBC: 9.1 10*3/uL (ref 4.0–10.5)
nRBC: 0 % (ref 0.0–0.2)

## 2022-09-23 LAB — HEPATIC FUNCTION PANEL
ALT: 18 U/L (ref 0–44)
AST: 20 U/L (ref 15–41)
Albumin: 3.1 g/dL — ABNORMAL LOW (ref 3.5–5.0)
Alkaline Phosphatase: 108 U/L (ref 38–126)
Bilirubin, Direct: 0.1 mg/dL (ref 0.0–0.2)
Indirect Bilirubin: 0.5 mg/dL (ref 0.3–0.9)
Total Bilirubin: 0.6 mg/dL (ref 0.3–1.2)
Total Protein: 6.4 g/dL — ABNORMAL LOW (ref 6.5–8.1)

## 2022-09-23 LAB — TROPONIN I (HIGH SENSITIVITY)
Troponin I (High Sensitivity): 9 ng/L (ref <18)
Troponin I (High Sensitivity): 9 ng/L (ref <18)

## 2022-09-23 LAB — LIPASE, BLOOD: Lipase: 36 U/L (ref 11–51)

## 2022-09-23 NOTE — ED Notes (Signed)
Lab agreed to add on labs to to labs collected.

## 2022-09-23 NOTE — Discharge Instructions (Addendum)
You have been evaluated for your chest pain.  Fortunately no evidence to suggest an ongoing heart attack.  Please follow-up with your doctor closely for outpatient management.  The cardiology clinic will likely reach out to you in the next several days for follow-up.  If you do not hear from the clinic, you may call the number below.  Return if you have any concern.
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
## 2022-09-23 NOTE — ED Notes (Signed)
[phone] Misty at [facility] wants update as possible.

## 2022-09-23 NOTE — ED Provider Notes (Signed)
[place] EMERGENCY DEPARTMENT Provider Note   CSN: [id number] Arrival date & time: 09/23/22  0308     History  Chief Complaint  Patient presents with   Extremity Weakness    Edwin Burns is a 81 y.o. male.  81 year old male with hx of COPD, DM, dementia presents to the emergency department from [facility].  Facility called EMS for patient complaining of chest pain radiating to the left arm.  Symptoms associated with diaphoresis. Patient's only complaint to EMS was of loss of function in his left leg x4 days.  He denies any chest pain at this time. Denies SOB, abdominal pain, back pain.  LEVEL 5 CAVEAT 2/2 DEMENTIA  The history is provided by the patient and the EMS personnel. No language interpreter was used.  Extremity Weakness       Home Medications Prior to Admission medications   Medication Sig Start Date End Date Taking? Authorizing Provider  aspirin 81 MG EC tablet Take by mouth.    [provider]  Durwin Reges 5-2.5-18.5 LF-MCG/0.5 injection  01/15/22   [provider]  cetirizine (ZYRTEC) 10 MG tablet Take by mouth.    [provider]  finasteride (PROSCAR) 5 MG tablet Take by mouth.    [provider]  hydrochlorothiazide (MICROZIDE) 12.5 MG capsule Take 12.5 mg by mouth daily. 01/29/22   [provider]  ketoconazole (NIZORAL) 2 % shampoo Apply topically. 10/12/21   [provider]  memantine (NAMENDA) 10 MG tablet Take 10 mg by mouth daily. 06/18/22   [provider]  neomycin-polymyxin b-dexamethasone (MAXITROL) 3.5-10000-0.1 SUSP 1 drop 4 (four) times daily. 10/31/21   [provider]  nystatin cream (MYCOSTATIN) Apply topically. 10/12/21   [provider]  olmesartan (BENICAR) 20 MG tablet Take 20 mg by mouth daily. 01/29/22   [provider]  olmesartan-hydrochlorothiazide (BENICAR HCT) 40-12.5 MG tablet Take 1 tablet by mouth daily.    [provider]  QUEtiapine (SEROQUEL) 25 MG tablet  06/18/22   [provider]  rosuvastatin (CRESTOR) 5 MG tablet Take 5 mg by mouth daily.    [provider]  sertraline (ZOLOFT) 25 MG tablet Take 25 mg by mouth daily. 06/07/22   [provider]  tamsulosin (FLOMAX) 0.4 MG CAPS capsule Take 0.8 mg by mouth daily after breakfast.    [provider]  triamcinolone cream (KENALOG) 0.1 % Apply topically. 10/12/21   [provider]  Turmeric 500 MG CAPS Take 1,000 mg by mouth daily.    [provider]      Allergies    Zolpidem, Simvastatin, and Codeine    Review of Systems   Review of Systems  Unable to perform ROS: Dementia  Musculoskeletal:  Positive for extremity weakness.    Physical Exam Updated Vital Signs BP 117/71   Pulse (!) 43   Temp 98.1 F (36.7 C) (Oral)   Resp 16   Ht '5\' 3"'$  (1.6 m)   Wt 81.6 kg   SpO2 93%   BMI 31.89 kg/m   Physical Exam Vitals and nursing note reviewed.  Constitutional:      General: He is not in acute distress.    Appearance: He is well-developed. He is not diaphoretic.     Comments: Nontoxic appearing and in NAD. Cheerful, conversant.  HENT:     Head: Normocephalic and atraumatic.  Eyes:     General: No scleral icterus.    Conjunctiva/sclera: Conjunctivae normal.  Cardiovascular:  Rate and Rhythm: Normal rate and regular rhythm.     Pulses: Normal pulses.  Pulmonary:     Effort: Pulmonary effort is normal. No respiratory distress.     Breath sounds: No stridor. No wheezing or rales.     Comments: Respirations even and unlabored. Lungs CTAB. Abdominal:     General: There is no distension.     Palpations: Abdomen is soft.     Comments: Soft, nontender, nondistended abdomen  Musculoskeletal:        General: Normal range of motion.     Cervical back: Normal range of motion.     Comments: 3/5 strength of the LLE without deformity, crepitus, TTP. 1+ edema BLE.  No tenderness to  palpation of the thoracic or lumbosacral midline.  Skin:    General: Skin is warm and dry.     Coloration: Skin is not pale.     Findings: No erythema or rash.  Neurological:     Mental Status: He is alert and oriented to person, place, and time.     Coordination: Coordination normal.     Comments: GCS 14. Sensation to light touch intact and equal in bilateral lower extremities  Psychiatric:        Behavior: Behavior normal.     ED Results / Procedures / Treatments   Labs (all labs ordered are listed, but only abnormal results are displayed) Labs Reviewed  BASIC METABOLIC PANEL - Abnormal; Notable for the following components:      Result Value   Glucose, Bld 102 (*)    All other components within normal limits  HEPATIC FUNCTION PANEL - Abnormal; Notable for the following components:   Total Protein 6.4 (*)    Albumin 3.1 (*)    All other components within normal limits  CBC WITH DIFFERENTIAL/PLATELET  LIPASE, BLOOD  TROPONIN I (HIGH SENSITIVITY)  TROPONIN I (HIGH SENSITIVITY)    EKG EKG Interpretation  Date/Time:  Sunday September 23 2022 03:15:38 EDT Ventricular Rate:  56 PR Interval:  127 QRS Duration: 107 QT Interval:  429 QTC Calculation: 414 R Axis:   18 Text Interpretation: Sinus rhythm Multiple premature complexes, vent & supraven Incomplete left bundle branch block Minimal ST elevation, anterolateral leads No previous ECGs available Confirmed by Quintella Reichert [phone]) on 09/23/2022 4:43:40 AM  Radiology DG Chest 2 View  Result Date: 09/23/2022 CLINICAL DATA:  Chest pain and weakness. EXAM: CHEST - 2 VIEW COMPARISON:  07/04/2022 FINDINGS: Heart size appears within normal limits. Lung volumes are low. No signs of pleural effusion or edema. Bilateral peripheral predominant reticular interstitial opacities are identified compatible with chronic fibrotic lung disease. No superimposed airspace consolidation. Visualized osseous structures appear intact. IMPRESSION: 1.  No acute findings. 2. 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 Electronically Signed   By: Kerby Moors M.D.   On: 09/23/2022 06:38   MR LUMBAR SPINE WO CONTRAST  Result Date: 09/23/2022 CLINICAL DATA:  Chest pain radiating into the left arm. Loss of function in the left leg EXAM: MRI THORACIC AND LUMBAR SPINE WITHOUT CONTRAST TECHNIQUE: Multiplanar and multiecho pulse sequences of the thoracic and lumbar spine were obtained without intravenous contrast. COMPARISON:  Lumbar MRI 08/08/2022 FINDINGS: MRI THORACIC SPINE FINDINGS Alignment:  Exaggerated thoracic kyphosis.  No thoracic listhesis. Vertebrae: Disc space T2 hyperintensity at T9-10 and T11-12, likely degenerative given absence of endplate or paravertebral edema. The degree of endplate irregularity and height loss at these levels is similar to a 05/16/2020 chest CT Cord:  Normal signal.  No evidence of intrathecal collection. Paraspinal and other soft tissues: Lipoma in the subcutaneous right upper back centered at roughly the T2-3 level, 5.3 cm in length by 2 cm in thickness. Disc levels: Disc space narrowing and desiccation with bulge and ridging that is generalized. Mid and lower thoracic endplate degeneration. Facet spurring and ligamentum flavum thickening especially at T7-8 to T10-11. Spurs and bulging disc impinge on the bilateral foramina at T9-10 and T10-11. Diffusely patent spinal canal. Partial coverage of the cervical spine on counting sequence shows advanced degeneration with reversal of cervical lordosis. MRI LUMBAR SPINE FINDINGS Segmentation:  5 lumbar type vertebrae Alignment:  Mild scoliosis Vertebrae: Remote L1 compression fracture with moderate height loss. No acute or subacute fracture. Marrow edema in the sacrum has essentially resolved. Conus medullaris and cauda equina: Conus extends to the L1 level. Conus and cauda equina appear normal. Paraspinal and other soft tissues: Negative for perispinal mass or inflammation.  Trabeculated bladder with enlarged prostate which is partially covered. Disc levels: L1-L2: Disc narrowing and bulging. Ligamentum flavum thickening. Moderate bilateral foraminal stenosis primarily from foraminal disc material L2-L3: Disc narrowing and bulging with degenerative facet spurring and ligamentum flavum thickening. Moderate spinal stenosis. Left more than right subarticular recess narrowing. L3-L4: Disc narrowing and bulging with endplate spurring. Bilateral facet spurring. Moderate spinal stenosis. Moderate left foraminal narrowing L4-L5: Disc narrowing and bulging with endplate spurring. Facet osteoarthritis with spurring bulkier on the right. Compressive spinal stenosis. Advanced right foraminal impingement and moderate left foraminal narrowing L5-S1:Disc space narrowing and bulging greatest at the foramina. Bilateral facet spurring. Biforaminal impingement. IMPRESSION: MR THORACIC SPINE IMPRESSION 1. No acute finding. 2. Generalized degeneration with notable foraminal narrowing bilaterally at T9-10 and T10-11. MR LUMBAR SPINE IMPRESSION 1. No acute finding.  Interval healing of S3 insufficiency fracture. 2. 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 3. Advanced foraminal impingement on the right at L4-5 and bilaterally at L5-S1. Electronically Signed   By: Jorje Guild M.D.   On: 09/23/2022 05:58   MR THORACIC SPINE WO CONTRAST  Result Date: 09/23/2022 CLINICAL DATA:  Chest pain radiating into the left arm. Loss of function in the left leg EXAM: MRI THORACIC AND LUMBAR SPINE WITHOUT CONTRAST TECHNIQUE: Multiplanar and multiecho pulse sequences of the thoracic and lumbar spine were obtained without intravenous contrast. COMPARISON:  Lumbar MRI 08/08/2022 FINDINGS: MRI THORACIC SPINE FINDINGS Alignment:  Exaggerated thoracic kyphosis.  No thoracic listhesis. Vertebrae: Disc space T2 hyperintensity at T9-10 and T11-12, likely degenerative given absence of endplate or  paravertebral edema. The degree of endplate irregularity and height loss at these levels is similar to a 05/16/2020 chest CT Cord:  Normal signal.  No evidence of intrathecal collection. Paraspinal and other soft tissues: Lipoma in the subcutaneous right upper back centered at roughly the T2-3 level, 5.3 cm in length by 2 cm in thickness. Disc levels: Disc space narrowing and desiccation with bulge and ridging that is generalized. Mid and lower thoracic endplate degeneration. Facet spurring and ligamentum flavum thickening especially at T7-8 to T10-11. Spurs and bulging disc impinge on the bilateral foramina at T9-10 and T10-11. Diffusely patent spinal canal. Partial coverage of the cervical spine on counting sequence shows advanced degeneration with reversal of cervical lordosis. MRI LUMBAR SPINE FINDINGS Segmentation:  5 lumbar type vertebrae Alignment:  Mild scoliosis Vertebrae: Remote L1 compression fracture with moderate height loss. No acute or subacute fracture. Marrow edema in the sacrum has essentially resolved. Conus medullaris  and cauda equina: Conus extends to the L1 level. Conus and cauda equina appear normal. Paraspinal and other soft tissues: Negative for perispinal mass or inflammation. Trabeculated bladder with enlarged prostate which is partially covered. Disc levels: L1-L2: Disc narrowing and bulging. Ligamentum flavum thickening. Moderate bilateral foraminal stenosis primarily from foraminal disc material L2-L3: Disc narrowing and bulging with degenerative facet spurring and ligamentum flavum thickening. Moderate spinal stenosis. Left more than right subarticular recess narrowing. L3-L4: Disc narrowing and bulging with endplate spurring. Bilateral facet spurring. Moderate spinal stenosis. Moderate left foraminal narrowing L4-L5: Disc narrowing and bulging with endplate spurring. Facet osteoarthritis with spurring bulkier on the right. Compressive spinal stenosis. Advanced right foraminal  impingement and moderate left foraminal narrowing L5-S1:Disc space narrowing and bulging greatest at the foramina. Bilateral facet spurring. Biforaminal impingement. IMPRESSION: MR THORACIC SPINE IMPRESSION 1. No acute finding. 2. Generalized degeneration with notable foraminal narrowing bilaterally at T9-10 and T10-11. MR LUMBAR SPINE IMPRESSION 1. No acute finding.  Interval healing of S3 insufficiency fracture. 2. 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 3. Advanced foraminal impingement on the right at L4-5 and bilaterally at L5-S1. Electronically Signed   By: Jorje Guild M.D.   On: 09/23/2022 05:58   CT HEAD WO CONTRAST (5MM)  Result Date: 09/23/2022 CLINICAL DATA:  Neuro deficit, acute, stroke suspected. Loss of function of left leg for 4 days. EXAM: CT HEAD WITHOUT CONTRAST TECHNIQUE: Contiguous axial images were obtained from the base of the skull through the vertex without intravenous contrast. RADIATION DOSE REDUCTION: This exam was performed according to the departmental dose-optimization program which includes automated exposure control, adjustment of the mA and/or kV according to patient size and/or use of iterative reconstruction technique. COMPARISON:  08/27/2022. FINDINGS: Brain: No acute intracranial hemorrhage, midline shift or mass effect. No extra-axial fluid collection. Diffuse atrophy is noted. Periventricular and subcortical white matter hypodensities are present bilaterally. 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 No hydrocephalus. Vascular: No hyperdense vessel or unexpected calcification. Skull: Normal. Negative for fracture or focal lesion. Sinuses/Orbits: No acute finding. Other: None. IMPRESSION: 1. No acute intracranial process. 2. Atrophy with chronic microvascular ischemic changes. Electronically Signed   By: Brett Fairy M.D.   On: 09/23/2022 04:44    Procedures Procedures    Medications Ordered in ED Medications - No  data to display  ED Course/ Medical Decision Making/ A&P Clinical Course as of 09/23/22 0938  Sun Sep 23, 2022  0445 Patient was seen by MD reduce and grandson of the patient was presently at bedside.  He was called to the facility this evening after the patient was having sudden onset of chest pain with radiation to the left arm.  He was reportedly diaphoretic.  By the time the grandson arrived at his SNF symptoms were spontaneously improving.  When grandson questioned the patient about his symptoms upon his arrival, the patient denied having any chest pain.  He apparently has very severe dementia.  Grandson and facility both state that left lower extremity weakness is more chronic than the patient originally reported.  We will continue to proceed with MRI to exclude any acute worsening given that the patient is such a poor historian. [KH]  0645 MRI, CT, and Xray imaging has been reviewed and interpreted.  No evidence of acute process today.  Specifically, chest x-ray negative for pneumonia, pneumothorax, pleural effusion.  Heart size appears normal. [KH]    Clinical Course User Index [KH]  Antonietta Breach, PA-C                           Medical Decision Making Amount and/or Complexity of Data Reviewed Labs: ordered. Radiology: ordered.   This patient presents to the ED for concern of chest pain, this involves an extensive number of treatment options, and is a complaint that carries with it a high risk of complications and morbidity.  The differential diagnosis includes PNA vs PTX vs pleural effusion vs ACS vs Afib w/RVR   Co morbidities that complicate the patient evaluation  Dementia  COPD DM   Additional history obtained:  Additional history obtained from ALF and grandson External records from outside source obtained and reviewed including MRI lumbar spine from August 2023   Lab Tests:  I Ordered, and personally interpreted labs.  The pertinent results include:  normal CBC, BMP,  lipase. Troponin negative.   Imaging Studies ordered:  I ordered imaging studies including CXR, MRI T/L spine, CT head  I independently visualized and interpreted imaging which are all negative for acute pathology I agree with the radiologist interpretation   Cardiac Monitoring:  The patient was maintained on a cardiac monitor.  I personally viewed and interpreted the cardiac monitored which showed an underlying rhythm of: bradycardia   Medicines ordered and prescription drug management:  I have reviewed the patients home medicines and have made adjustments as needed   Problem List / ED Course:  As above   Reevaluation:  After the interventions noted above, I reevaluated the patient and found that they have :stayed the same   Social Determinants of Health:  Insured    Dispostion:  Care signed out to [place], PA-C at change of shift pending delta troponin results.  Anticipate discharge if work-up remains reassuring.         Final Clinical Impression(s) / ED Diagnoses Final diagnoses:  Chest pain, unspecified type  Weakness of left leg    Rx / DC Orders ED Discharge Orders     None         Antonietta Breach, PA-C 09/23/22 0719    Quintella Reichert, MD 09/27/22 [phone]

## 2022-09-23 NOTE — ED Notes (Signed)
Patient resting in bed, no s/s of distress, respirations are even and unlabored, grandson present at bedside, will continue to monitor.

## 2022-09-23 NOTE — ED Notes (Signed)
Patient to MRI.

## 2022-09-24 DIAGNOSIS — R451 Restlessness and agitation: Secondary | ICD-10-CM | POA: Diagnosis not present

## 2022-09-24 DIAGNOSIS — F419 Anxiety disorder, unspecified: Secondary | ICD-10-CM | POA: Diagnosis not present

## 2022-09-24 DIAGNOSIS — F03918 Unspecified dementia, unspecified severity, with other behavioral disturbance: Secondary | ICD-10-CM | POA: Diagnosis not present

## 2022-09-25 DIAGNOSIS — M21932 Unspecified acquired deformity of left forearm: Secondary | ICD-10-CM | POA: Diagnosis not present

## 2022-09-25 DIAGNOSIS — S52502A Unspecified fracture of the lower end of left radius, initial encounter for closed fracture: Secondary | ICD-10-CM | POA: Diagnosis not present

## 2022-09-25 DIAGNOSIS — S50311A Abrasion of right elbow, initial encounter: Secondary | ICD-10-CM | POA: Diagnosis not present

## 2022-09-25 DIAGNOSIS — S61412A Laceration without foreign body of left hand, initial encounter: Secondary | ICD-10-CM | POA: Diagnosis not present

## 2022-09-25 DIAGNOSIS — W19XXXA Unspecified fall, initial encounter: Secondary | ICD-10-CM | POA: Diagnosis not present

## 2022-09-25 DIAGNOSIS — S0003XA Contusion of scalp, initial encounter: Secondary | ICD-10-CM | POA: Diagnosis not present

## 2022-09-25 DIAGNOSIS — R58 Hemorrhage, not elsewhere classified: Secondary | ICD-10-CM | POA: Diagnosis not present

## 2022-09-25 DIAGNOSIS — Z043 Encounter for examination and observation following other accident: Secondary | ICD-10-CM | POA: Diagnosis not present

## 2022-09-25 DIAGNOSIS — I6782 Cerebral ischemia: Secondary | ICD-10-CM | POA: Diagnosis not present

## 2022-09-26 DIAGNOSIS — Z043 Encounter for examination and observation following other accident: Secondary | ICD-10-CM | POA: Diagnosis not present

## 2022-09-26 DIAGNOSIS — S0083XA Contusion of other part of head, initial encounter: Secondary | ICD-10-CM | POA: Diagnosis not present

## 2022-09-26 DIAGNOSIS — S61411A Laceration without foreign body of right hand, initial encounter: Secondary | ICD-10-CM | POA: Diagnosis not present

## 2022-09-26 DIAGNOSIS — S61011A Laceration without foreign body of right thumb without damage to nail, initial encounter: Secondary | ICD-10-CM | POA: Diagnosis not present

## 2022-09-26 DIAGNOSIS — I6782 Cerebral ischemia: Secondary | ICD-10-CM | POA: Diagnosis not present

## 2022-09-26 DIAGNOSIS — R079 Chest pain, unspecified: Secondary | ICD-10-CM | POA: Diagnosis not present

## 2022-09-26 DIAGNOSIS — S50311A Abrasion of right elbow, initial encounter: Secondary | ICD-10-CM | POA: Diagnosis not present

## 2022-09-26 DIAGNOSIS — S61412A Laceration without foreign body of left hand, initial encounter: Secondary | ICD-10-CM | POA: Diagnosis not present

## 2022-09-26 DIAGNOSIS — S52502A Unspecified fracture of the lower end of left radius, initial encounter for closed fracture: Secondary | ICD-10-CM | POA: Diagnosis not present

## 2022-09-27 DIAGNOSIS — Z23 Encounter for immunization: Secondary | ICD-10-CM | POA: Diagnosis not present

## 2022-09-28 ENCOUNTER — Telehealth: Payer: Self-pay

## 2022-09-28 NOTE — Telephone Encounter (Signed)
     Patient  visit on 10/1  at [facility]  Have you been able to follow up with your primary care physician?  YES  The patient was or was not able to obtain any needed medicine or equipment. YES  Are there diet recommendations that you are having difficulty following? NA  Patient expresses understanding of discharge instructions and education provided has no other needs at this time.  [place], [facility], Care Management  [phone] [address], [place], [place] [address] Phone: [phone] Email: [email or web address]

## 2022-10-01 ENCOUNTER — Ambulatory Visit: Payer: PPO | Admitting: Cardiology

## 2022-10-01 DIAGNOSIS — R0781 Pleurodynia: Secondary | ICD-10-CM | POA: Diagnosis not present

## 2022-10-01 DIAGNOSIS — M6281 Muscle weakness (generalized): Secondary | ICD-10-CM | POA: Diagnosis not present

## 2022-10-01 DIAGNOSIS — R079 Chest pain, unspecified: Secondary | ICD-10-CM | POA: Diagnosis not present

## 2022-10-01 DIAGNOSIS — F0390 Unspecified dementia without behavioral disturbance: Secondary | ICD-10-CM | POA: Diagnosis not present

## 2022-10-01 DIAGNOSIS — W19XXXA Unspecified fall, initial encounter: Secondary | ICD-10-CM | POA: Diagnosis not present

## 2022-10-01 DIAGNOSIS — R269 Unspecified abnormalities of gait and mobility: Secondary | ICD-10-CM | POA: Diagnosis not present

## 2022-10-04 ENCOUNTER — Ambulatory Visit: Payer: PPO | Admitting: Podiatry

## 2022-10-04 ENCOUNTER — Encounter: Payer: Self-pay | Admitting: Podiatry

## 2022-10-04 DIAGNOSIS — B351 Tinea unguium: Secondary | ICD-10-CM

## 2022-10-04 DIAGNOSIS — M79674 Pain in right toe(s): Secondary | ICD-10-CM | POA: Diagnosis not present

## 2022-10-04 DIAGNOSIS — E1151 Type 2 diabetes mellitus with diabetic peripheral angiopathy without gangrene: Secondary | ICD-10-CM

## 2022-10-04 DIAGNOSIS — M79675 Pain in left toe(s): Secondary | ICD-10-CM | POA: Diagnosis not present

## 2022-10-04 NOTE — Progress Notes (Signed)
Subjective:  Patient ID: Edwin Burns, male    DOB: 10-21-1941,  MRN: [id number]  Edwin Burns presents to clinic today for at risk diabetic foot care with h/o NIDDM and PAD. He and his wife are residents at [facility]. He is accompanied by his son on today's visit.  Chief Complaint  Patient presents with   Nail Problem    Routine foot care PCP-Christy Hearne PCP [facility]-PCP comes to the facility    New problem(s): None.  Allergies  Allergen Reactions   Zolpidem Other (See Comments)    Blacked out Hallucinations Hallucinations    Simvastatin Other (See Comments)    LFT Elevation LFT Elevation    Codeine Itching and Rash    Review of Systems: Negative except as noted in the HPI.  Objective: No changes noted in today's physical examination.  Edwin Burns is a pleasant 81 y.o. male in NAD. AAO x 3.  Vascular Examination:  CFT immediate b/l LE. Palpable DP/PT pulses b/l LE. Digital hair absent b/l. Skin temperature gradient WNL b/l. No pain with calf compression b/l. No edema noted b/l. No cyanosis or clubbing noted b/l LE.  Dermatological Examination: Pedal skin is warm and supple b/l LE. Toenails 1-5 b/l elongated, discolored, dystrophic, thickened, crumbly with subungual debris and tenderness to dorsal palpation. No hyperkeratotic nor porokeratotic lesions present on today's visit.  Musculoskeletal: Muscle strength 5/5 to all lower extremity muscle groups bilaterally. HAV with bunion deformity noted b/l LE.  Neurological: Protective sensation intact 5/5 intact bilaterally with 10g monofilament b/l. Vibratory sensation intact b/l.  Assessment/Plan: 1. Pain due to onychomycosis of toenails of both feet   2. Type II diabetes mellitus with peripheral circulatory disorder (HCC)     No orders of the defined types were placed in this encounter.   -Patient's family member present. All questions/concerns addressed on today's visit. -Facility  to continue fall precautions and pressure precautions. -Patient to continue soft, supportive shoe gear daily. -Mycotic toenails 1-5 bilaterally were debrided in length and girth with sterile nail nippers and dremel without incident. -Patient/POA to call should there be question/concern in the interim.   Return in about 3 months (around 01/04/2023).  Freddie Breech, DPM

## 2022-10-24 DIAGNOSIS — F03918 Unspecified dementia, unspecified severity, with other behavioral disturbance: Secondary | ICD-10-CM | POA: Diagnosis not present

## 2022-10-24 DIAGNOSIS — F0390 Unspecified dementia without behavioral disturbance: Secondary | ICD-10-CM | POA: Diagnosis not present

## 2022-10-24 DIAGNOSIS — G894 Chronic pain syndrome: Secondary | ICD-10-CM | POA: Diagnosis not present

## 2022-10-24 DIAGNOSIS — R451 Restlessness and agitation: Secondary | ICD-10-CM | POA: Diagnosis not present

## 2022-10-24 DIAGNOSIS — Z5181 Encounter for therapeutic drug level monitoring: Secondary | ICD-10-CM | POA: Diagnosis not present

## 2022-10-24 DIAGNOSIS — F419 Anxiety disorder, unspecified: Secondary | ICD-10-CM | POA: Diagnosis not present

## 2022-10-31 DIAGNOSIS — R2689 Other abnormalities of gait and mobility: Secondary | ICD-10-CM | POA: Diagnosis not present

## 2022-10-31 DIAGNOSIS — R269 Unspecified abnormalities of gait and mobility: Secondary | ICD-10-CM | POA: Diagnosis not present

## 2022-10-31 DIAGNOSIS — M6281 Muscle weakness (generalized): Secondary | ICD-10-CM | POA: Diagnosis not present

## 2022-10-31 DIAGNOSIS — S51012A Laceration without foreign body of left elbow, initial encounter: Secondary | ICD-10-CM | POA: Diagnosis not present

## 2022-11-14 DIAGNOSIS — U071 COVID-19: Secondary | ICD-10-CM | POA: Diagnosis not present

## 2022-11-14 DIAGNOSIS — R059 Cough, unspecified: Secondary | ICD-10-CM | POA: Diagnosis not present

## 2022-11-14 DIAGNOSIS — F0390 Unspecified dementia without behavioral disturbance: Secondary | ICD-10-CM | POA: Diagnosis not present

## 2022-11-14 DIAGNOSIS — R5381 Other malaise: Secondary | ICD-10-CM | POA: Diagnosis not present

## 2022-11-22 DIAGNOSIS — R269 Unspecified abnormalities of gait and mobility: Secondary | ICD-10-CM | POA: Diagnosis not present

## 2022-11-22 DIAGNOSIS — L22 Diaper dermatitis: Secondary | ICD-10-CM | POA: Diagnosis not present

## 2022-11-22 DIAGNOSIS — F0390 Unspecified dementia without behavioral disturbance: Secondary | ICD-10-CM | POA: Diagnosis not present

## 2022-11-26 DIAGNOSIS — M6281 Muscle weakness (generalized): Secondary | ICD-10-CM | POA: Diagnosis not present

## 2022-11-26 DIAGNOSIS — L89310 Pressure ulcer of right buttock, unstageable: Secondary | ICD-10-CM | POA: Diagnosis not present

## 2022-11-26 DIAGNOSIS — R2689 Other abnormalities of gait and mobility: Secondary | ICD-10-CM | POA: Diagnosis not present

## 2022-11-26 DIAGNOSIS — S51011A Laceration without foreign body of right elbow, initial encounter: Secondary | ICD-10-CM | POA: Diagnosis not present

## 2022-11-28 DIAGNOSIS — S51011D Laceration without foreign body of right elbow, subsequent encounter: Secondary | ICD-10-CM | POA: Diagnosis not present

## 2022-11-28 DIAGNOSIS — E1151 Type 2 diabetes mellitus with diabetic peripheral angiopathy without gangrene: Secondary | ICD-10-CM | POA: Diagnosis not present

## 2022-11-28 DIAGNOSIS — D649 Anemia, unspecified: Secondary | ICD-10-CM | POA: Diagnosis not present

## 2022-11-28 DIAGNOSIS — Z87442 Personal history of urinary calculi: Secondary | ICD-10-CM | POA: Diagnosis not present

## 2022-11-28 DIAGNOSIS — Z86711 Personal history of pulmonary embolism: Secondary | ICD-10-CM | POA: Diagnosis not present

## 2022-11-28 DIAGNOSIS — E039 Hypothyroidism, unspecified: Secondary | ICD-10-CM | POA: Diagnosis not present

## 2022-11-28 DIAGNOSIS — J4489 Other specified chronic obstructive pulmonary disease: Secondary | ICD-10-CM | POA: Diagnosis not present

## 2022-11-28 DIAGNOSIS — I119 Hypertensive heart disease without heart failure: Secondary | ICD-10-CM | POA: Diagnosis not present

## 2022-11-28 DIAGNOSIS — E785 Hyperlipidemia, unspecified: Secondary | ICD-10-CM | POA: Diagnosis not present

## 2022-11-28 DIAGNOSIS — E559 Vitamin D deficiency, unspecified: Secondary | ICD-10-CM | POA: Diagnosis not present

## 2022-11-28 DIAGNOSIS — R451 Restlessness and agitation: Secondary | ICD-10-CM | POA: Diagnosis not present

## 2022-11-28 DIAGNOSIS — L89310 Pressure ulcer of right buttock, unstageable: Secondary | ICD-10-CM | POA: Diagnosis not present

## 2022-11-28 DIAGNOSIS — Z79891 Long term (current) use of opiate analgesic: Secondary | ICD-10-CM | POA: Diagnosis not present

## 2022-11-28 DIAGNOSIS — Z87891 Personal history of nicotine dependence: Secondary | ICD-10-CM | POA: Diagnosis not present

## 2022-11-28 DIAGNOSIS — M1612 Unilateral primary osteoarthritis, left hip: Secondary | ICD-10-CM | POA: Diagnosis not present

## 2022-11-28 DIAGNOSIS — L89312 Pressure ulcer of right buttock, stage 2: Secondary | ICD-10-CM | POA: Diagnosis not present

## 2022-11-28 DIAGNOSIS — F03918 Unspecified dementia, unspecified severity, with other behavioral disturbance: Secondary | ICD-10-CM | POA: Diagnosis not present

## 2022-11-28 DIAGNOSIS — I700 Atherosclerosis of aorta: Secondary | ICD-10-CM | POA: Diagnosis not present

## 2022-11-28 DIAGNOSIS — F419 Anxiety disorder, unspecified: Secondary | ICD-10-CM | POA: Diagnosis not present

## 2022-11-28 DIAGNOSIS — Z8616 Personal history of COVID-19: Secondary | ICD-10-CM | POA: Diagnosis not present

## 2022-11-28 DIAGNOSIS — J84112 Idiopathic pulmonary fibrosis: Secondary | ICD-10-CM | POA: Diagnosis not present

## 2022-11-28 DIAGNOSIS — I4891 Unspecified atrial fibrillation: Secondary | ICD-10-CM | POA: Diagnosis not present

## 2022-11-28 DIAGNOSIS — M4800 Spinal stenosis, site unspecified: Secondary | ICD-10-CM | POA: Diagnosis not present

## 2022-11-28 DIAGNOSIS — I745 Embolism and thrombosis of iliac artery: Secondary | ICD-10-CM | POA: Diagnosis not present

## 2022-11-28 DIAGNOSIS — N400 Enlarged prostate without lower urinary tract symptoms: Secondary | ICD-10-CM | POA: Diagnosis not present

## 2022-11-28 DIAGNOSIS — F0390 Unspecified dementia without behavioral disturbance: Secondary | ICD-10-CM | POA: Diagnosis not present

## 2022-11-29 DIAGNOSIS — L89300 Pressure ulcer of unspecified buttock, unstageable: Secondary | ICD-10-CM | POA: Diagnosis not present

## 2022-11-29 DIAGNOSIS — R269 Unspecified abnormalities of gait and mobility: Secondary | ICD-10-CM | POA: Diagnosis not present

## 2022-11-29 DIAGNOSIS — F0390 Unspecified dementia without behavioral disturbance: Secondary | ICD-10-CM | POA: Diagnosis not present

## 2022-11-29 DIAGNOSIS — M6281 Muscle weakness (generalized): Secondary | ICD-10-CM | POA: Diagnosis not present

## 2022-12-03 DIAGNOSIS — F0390 Unspecified dementia without behavioral disturbance: Secondary | ICD-10-CM | POA: Diagnosis not present

## 2022-12-03 DIAGNOSIS — R4182 Altered mental status, unspecified: Secondary | ICD-10-CM | POA: Diagnosis not present

## 2022-12-03 DIAGNOSIS — R52 Pain, unspecified: Secondary | ICD-10-CM | POA: Diagnosis not present

## 2022-12-06 DIAGNOSIS — N390 Urinary tract infection, site not specified: Secondary | ICD-10-CM | POA: Diagnosis not present

## 2022-12-10 DIAGNOSIS — L578 Other skin changes due to chronic exposure to nonionizing radiation: Secondary | ICD-10-CM | POA: Diagnosis not present

## 2022-12-10 DIAGNOSIS — F0390 Unspecified dementia without behavioral disturbance: Secondary | ICD-10-CM | POA: Diagnosis not present

## 2022-12-10 DIAGNOSIS — R269 Unspecified abnormalities of gait and mobility: Secondary | ICD-10-CM | POA: Diagnosis not present

## 2022-12-10 DIAGNOSIS — L570 Actinic keratosis: Secondary | ICD-10-CM | POA: Diagnosis not present

## 2022-12-10 DIAGNOSIS — M6281 Muscle weakness (generalized): Secondary | ICD-10-CM | POA: Diagnosis not present

## 2022-12-10 DIAGNOSIS — L821 Other seborrheic keratosis: Secondary | ICD-10-CM | POA: Diagnosis not present

## 2022-12-10 DIAGNOSIS — R2689 Other abnormalities of gait and mobility: Secondary | ICD-10-CM | POA: Diagnosis not present

## 2022-12-12 DIAGNOSIS — J449 Chronic obstructive pulmonary disease, unspecified: Secondary | ICD-10-CM | POA: Diagnosis not present

## 2022-12-12 DIAGNOSIS — F0390 Unspecified dementia without behavioral disturbance: Secondary | ICD-10-CM | POA: Diagnosis not present

## 2022-12-12 DIAGNOSIS — I119 Hypertensive heart disease without heart failure: Secondary | ICD-10-CM | POA: Diagnosis not present

## 2022-12-12 DIAGNOSIS — E119 Type 2 diabetes mellitus without complications: Secondary | ICD-10-CM | POA: Diagnosis not present

## 2022-12-13 DIAGNOSIS — S30810A Abrasion of lower back and pelvis, initial encounter: Secondary | ICD-10-CM | POA: Diagnosis not present

## 2022-12-13 DIAGNOSIS — S51011A Laceration without foreign body of right elbow, initial encounter: Secondary | ICD-10-CM | POA: Diagnosis not present

## 2022-12-13 DIAGNOSIS — I10 Essential (primary) hypertension: Secondary | ICD-10-CM | POA: Diagnosis not present

## 2022-12-13 DIAGNOSIS — F0390 Unspecified dementia without behavioral disturbance: Secondary | ICD-10-CM | POA: Diagnosis not present

## 2022-12-13 DIAGNOSIS — D649 Anemia, unspecified: Secondary | ICD-10-CM | POA: Diagnosis not present

## 2022-12-13 DIAGNOSIS — S20224A Contusion of middle back wall of thorax, initial encounter: Secondary | ICD-10-CM | POA: Diagnosis not present

## 2022-12-13 DIAGNOSIS — E119 Type 2 diabetes mellitus without complications: Secondary | ICD-10-CM | POA: Diagnosis not present

## 2022-12-19 DIAGNOSIS — I509 Heart failure, unspecified: Secondary | ICD-10-CM | POA: Diagnosis not present

## 2022-12-19 DIAGNOSIS — J986 Disorders of diaphragm: Secondary | ICD-10-CM | POA: Diagnosis not present

## 2022-12-19 DIAGNOSIS — M6281 Muscle weakness (generalized): Secondary | ICD-10-CM | POA: Diagnosis not present

## 2022-12-19 DIAGNOSIS — R059 Cough, unspecified: Secondary | ICD-10-CM | POA: Diagnosis not present

## 2022-12-19 DIAGNOSIS — R0989 Other specified symptoms and signs involving the circulatory and respiratory systems: Secondary | ICD-10-CM | POA: Diagnosis not present

## 2022-12-19 DIAGNOSIS — R269 Unspecified abnormalities of gait and mobility: Secondary | ICD-10-CM | POA: Diagnosis not present

## 2022-12-20 DIAGNOSIS — E876 Hypokalemia: Secondary | ICD-10-CM | POA: Diagnosis not present

## 2022-12-20 DIAGNOSIS — N1831 Chronic kidney disease, stage 3a: Secondary | ICD-10-CM | POA: Diagnosis not present

## 2022-12-20 DIAGNOSIS — I5032 Chronic diastolic (congestive) heart failure: Secondary | ICD-10-CM | POA: Diagnosis not present

## 2022-12-20 DIAGNOSIS — J09X2 Influenza due to identified novel influenza A virus with other respiratory manifestations: Secondary | ICD-10-CM | POA: Diagnosis not present

## 2022-12-22 DIAGNOSIS — R0989 Other specified symptoms and signs involving the circulatory and respiratory systems: Secondary | ICD-10-CM | POA: Diagnosis not present

## 2022-12-22 DIAGNOSIS — R918 Other nonspecific abnormal finding of lung field: Secondary | ICD-10-CM | POA: Diagnosis not present

## 2022-12-26 DIAGNOSIS — F03918 Unspecified dementia, unspecified severity, with other behavioral disturbance: Secondary | ICD-10-CM | POA: Diagnosis not present

## 2022-12-26 DIAGNOSIS — R451 Restlessness and agitation: Secondary | ICD-10-CM | POA: Diagnosis not present

## 2022-12-26 DIAGNOSIS — F419 Anxiety disorder, unspecified: Secondary | ICD-10-CM | POA: Diagnosis not present

## 2022-12-27 DIAGNOSIS — I10 Essential (primary) hypertension: Secondary | ICD-10-CM | POA: Diagnosis not present

## 2022-12-28 DIAGNOSIS — J449 Chronic obstructive pulmonary disease, unspecified: Secondary | ICD-10-CM | POA: Diagnosis not present

## 2022-12-28 DIAGNOSIS — J189 Pneumonia, unspecified organism: Secondary | ICD-10-CM | POA: Diagnosis not present

## 2022-12-28 DIAGNOSIS — J09X2 Influenza due to identified novel influenza A virus with other respiratory manifestations: Secondary | ICD-10-CM | POA: Diagnosis not present

## 2022-12-28 DIAGNOSIS — F0390 Unspecified dementia without behavioral disturbance: Secondary | ICD-10-CM | POA: Diagnosis not present

## 2022-12-28 DIAGNOSIS — R059 Cough, unspecified: Secondary | ICD-10-CM | POA: Diagnosis not present

## 2023-01-04 DIAGNOSIS — R2689 Other abnormalities of gait and mobility: Secondary | ICD-10-CM | POA: Diagnosis not present

## 2023-01-04 DIAGNOSIS — R269 Unspecified abnormalities of gait and mobility: Secondary | ICD-10-CM | POA: Diagnosis not present

## 2023-01-04 DIAGNOSIS — W19XXXA Unspecified fall, initial encounter: Secondary | ICD-10-CM | POA: Diagnosis not present

## 2023-01-04 DIAGNOSIS — M6281 Muscle weakness (generalized): Secondary | ICD-10-CM | POA: Diagnosis not present

## 2023-01-04 DIAGNOSIS — Z9181 History of falling: Secondary | ICD-10-CM | POA: Diagnosis not present

## 2023-01-04 DIAGNOSIS — F0390 Unspecified dementia without behavioral disturbance: Secondary | ICD-10-CM | POA: Diagnosis not present

## 2023-01-16 DIAGNOSIS — F0390 Unspecified dementia without behavioral disturbance: Secondary | ICD-10-CM

## 2023-01-16 DIAGNOSIS — I959 Hypotension, unspecified: Secondary | ICD-10-CM

## 2023-01-17 ENCOUNTER — Encounter: Payer: Self-pay | Admitting: Podiatry

## 2023-01-17 ENCOUNTER — Ambulatory Visit: Payer: PPO | Admitting: Podiatry

## 2023-01-17 VITALS — BP 102/58

## 2023-01-17 DIAGNOSIS — B351 Tinea unguium: Secondary | ICD-10-CM

## 2023-01-17 DIAGNOSIS — M79674 Pain in right toe(s): Secondary | ICD-10-CM | POA: Diagnosis not present

## 2023-01-17 DIAGNOSIS — M79675 Pain in left toe(s): Secondary | ICD-10-CM | POA: Diagnosis not present

## 2023-01-17 NOTE — Progress Notes (Signed)
Subjective:  Patient ID: Edwin Burns, male    DOB: 1941/06/10,  MRN: [id number]  Edwin Burns presents to clinic today for painful thick toenails that are difficult to trim. Pain interferes with ambulation. Aggravating factors include wearing enclosed shoe gear. Pain is relieved with periodic professional debridement.  Chief Complaint  Patient presents with   Nail Problem    RFC  Not Diabetic  PCP - Edwin Burns   New problem(s): None.   Patient is accompanied by his son on today's visit.  Edwin Burns is a resident of [facility].  PCP is Edwin Burns, Edwin Burns.  Allergies  Allergen Reactions   Zolpidem Other (See Comments)    Blacked out Hallucinations Hallucinations    Simvastatin Other (See Comments)    LFT Elevation LFT Elevation    Codeine Itching and Rash   Review of Systems: Negative except as noted in the HPI.  Objective: No changes noted in today's physical examination. Vitals:   01/17/23 1058  BP: (!) 102/58   Edwin Burns is a pleasant 82 y.o. male thin build in NAD. AAO x 3.  Vascular Examination:  CFT immediate b/l LE. Palpable DP/PT pulses b/l LE. Digital hair absent b/l. Skin temperature gradient WNL b/l. No pain with calf compression b/l. No edema noted b/l. No cyanosis or clubbing noted b/l LE.  Dermatological Examination: Pedal skin is warm and supple b/l LE. Toenails 1-5 b/l elongated, discolored, dystrophic, thickened, crumbly with subungual debris and tenderness to dorsal palpation. No hyperkeratotic nor porokeratotic lesions present on today's visit.  Musculoskeletal: Muscle strength 5/5 to all lower extremity muscle groups bilaterally. HAV with bunion deformity noted b/l LE.  Neurological: Protective sensation intact 5/5 intact bilaterally with 10g monofilament b/l. Vibratory sensation intact b/l.  Assessment/Plan: 1. Pain due to onychomycosis of toenails of both feet     -Patient was evaluated and treated.  All patient's and/or POA's questions/concerns answered on today's visit. -Facility to continue fall precautions and pressure precautions. -Continue supportive shoe gear daily. -Toenails 1-5 b/l were debrided in length and girth with sterile nail nippers and dremel without iatrogenic bleeding.  -Patient/POA to call should there be question/concern in the interim.   Return in about 3 months (around 04/18/2023).  Edwin Burns, DPM

## 2023-01-18 ENCOUNTER — Telehealth (HOSPITAL_COMMUNITY): Payer: Self-pay

## 2023-01-18 NOTE — Telephone Encounter (Signed)
Spoke with Cyndie Chime, informed that he is in charge of medical + bills, stated that we are good for right now, regarding the follow up appointment for [facility], I asked if he wanted the appointment closed out or left open, closed was the response. Documenting call.

## 2023-01-30 DIAGNOSIS — R451 Restlessness and agitation: Secondary | ICD-10-CM | POA: Diagnosis not present

## 2023-01-30 DIAGNOSIS — F03918 Unspecified dementia, unspecified severity, with other behavioral disturbance: Secondary | ICD-10-CM | POA: Diagnosis not present

## 2023-01-30 DIAGNOSIS — F419 Anxiety disorder, unspecified: Secondary | ICD-10-CM | POA: Diagnosis not present

## 2023-02-07 DIAGNOSIS — I119 Hypertensive heart disease without heart failure: Secondary | ICD-10-CM | POA: Diagnosis not present

## 2023-02-07 DIAGNOSIS — F0390 Unspecified dementia without behavioral disturbance: Secondary | ICD-10-CM | POA: Diagnosis not present

## 2023-02-15 DIAGNOSIS — Z9181 History of falling: Secondary | ICD-10-CM | POA: Diagnosis not present

## 2023-02-15 DIAGNOSIS — F0390 Unspecified dementia without behavioral disturbance: Secondary | ICD-10-CM | POA: Diagnosis not present

## 2023-02-15 DIAGNOSIS — S51012A Laceration without foreign body of left elbow, initial encounter: Secondary | ICD-10-CM | POA: Diagnosis not present

## 2023-02-15 DIAGNOSIS — R269 Unspecified abnormalities of gait and mobility: Secondary | ICD-10-CM | POA: Diagnosis not present

## 2023-02-15 DIAGNOSIS — M6281 Muscle weakness (generalized): Secondary | ICD-10-CM | POA: Diagnosis not present

## 2023-02-15 DIAGNOSIS — W19XXXA Unspecified fall, initial encounter: Secondary | ICD-10-CM | POA: Diagnosis not present

## 2023-02-15 DIAGNOSIS — R2689 Other abnormalities of gait and mobility: Secondary | ICD-10-CM | POA: Diagnosis not present

## 2023-02-21 DIAGNOSIS — R269 Unspecified abnormalities of gait and mobility: Secondary | ICD-10-CM | POA: Diagnosis not present

## 2023-02-21 DIAGNOSIS — F0390 Unspecified dementia without behavioral disturbance: Secondary | ICD-10-CM | POA: Diagnosis not present

## 2023-02-21 DIAGNOSIS — R2689 Other abnormalities of gait and mobility: Secondary | ICD-10-CM | POA: Diagnosis not present

## 2023-02-21 DIAGNOSIS — W19XXXA Unspecified fall, initial encounter: Secondary | ICD-10-CM | POA: Diagnosis not present

## 2023-02-21 DIAGNOSIS — M6281 Muscle weakness (generalized): Secondary | ICD-10-CM | POA: Diagnosis not present

## 2023-02-21 DIAGNOSIS — S7002XA Contusion of left hip, initial encounter: Secondary | ICD-10-CM | POA: Diagnosis not present

## 2023-02-21 DIAGNOSIS — M25552 Pain in left hip: Secondary | ICD-10-CM | POA: Diagnosis not present

## 2023-02-21 DIAGNOSIS — S79919A Unspecified injury of unspecified hip, initial encounter: Secondary | ICD-10-CM | POA: Diagnosis not present

## 2023-02-21 DIAGNOSIS — S21211A Laceration without foreign body of right back wall of thorax without penetration into thoracic cavity, initial encounter: Secondary | ICD-10-CM | POA: Diagnosis not present

## 2023-02-21 DIAGNOSIS — S51811A Laceration without foreign body of right forearm, initial encounter: Secondary | ICD-10-CM | POA: Diagnosis not present

## 2023-02-21 DIAGNOSIS — M1612 Unilateral primary osteoarthritis, left hip: Secondary | ICD-10-CM | POA: Diagnosis not present

## 2023-02-21 DIAGNOSIS — R102 Pelvic and perineal pain: Secondary | ICD-10-CM | POA: Diagnosis not present

## 2023-02-22 DIAGNOSIS — M6281 Muscle weakness (generalized): Secondary | ICD-10-CM | POA: Diagnosis not present

## 2023-02-22 DIAGNOSIS — R2689 Other abnormalities of gait and mobility: Secondary | ICD-10-CM | POA: Diagnosis not present

## 2023-02-22 DIAGNOSIS — Z9181 History of falling: Secondary | ICD-10-CM | POA: Diagnosis not present

## 2023-02-22 DIAGNOSIS — W19XXXA Unspecified fall, initial encounter: Secondary | ICD-10-CM | POA: Diagnosis not present

## 2023-02-22 DIAGNOSIS — R269 Unspecified abnormalities of gait and mobility: Secondary | ICD-10-CM | POA: Diagnosis not present

## 2023-02-22 DIAGNOSIS — S51811A Laceration without foreign body of right forearm, initial encounter: Secondary | ICD-10-CM | POA: Diagnosis not present

## 2023-02-22 DIAGNOSIS — F0390 Unspecified dementia without behavioral disturbance: Secondary | ICD-10-CM | POA: Diagnosis not present

## 2023-02-26 DIAGNOSIS — R451 Restlessness and agitation: Secondary | ICD-10-CM | POA: Diagnosis not present

## 2023-02-26 DIAGNOSIS — F03918 Unspecified dementia, unspecified severity, with other behavioral disturbance: Secondary | ICD-10-CM | POA: Diagnosis not present

## 2023-02-26 DIAGNOSIS — F419 Anxiety disorder, unspecified: Secondary | ICD-10-CM | POA: Diagnosis not present

## 2023-02-27 DIAGNOSIS — R269 Unspecified abnormalities of gait and mobility: Secondary | ICD-10-CM | POA: Diagnosis not present

## 2023-02-27 DIAGNOSIS — M6281 Muscle weakness (generalized): Secondary | ICD-10-CM | POA: Diagnosis not present

## 2023-02-27 DIAGNOSIS — R2689 Other abnormalities of gait and mobility: Secondary | ICD-10-CM | POA: Diagnosis not present

## 2023-02-27 DIAGNOSIS — F0390 Unspecified dementia without behavioral disturbance: Secondary | ICD-10-CM | POA: Diagnosis not present

## 2023-02-27 DIAGNOSIS — W19XXXA Unspecified fall, initial encounter: Secondary | ICD-10-CM | POA: Diagnosis not present

## 2023-02-27 DIAGNOSIS — Z9181 History of falling: Secondary | ICD-10-CM | POA: Diagnosis not present

## 2023-03-04 DIAGNOSIS — R2689 Other abnormalities of gait and mobility: Secondary | ICD-10-CM | POA: Diagnosis not present

## 2023-03-04 DIAGNOSIS — Z9181 History of falling: Secondary | ICD-10-CM | POA: Diagnosis not present

## 2023-03-04 DIAGNOSIS — F0390 Unspecified dementia without behavioral disturbance: Secondary | ICD-10-CM | POA: Diagnosis not present

## 2023-03-04 DIAGNOSIS — R296 Repeated falls: Secondary | ICD-10-CM | POA: Diagnosis not present

## 2023-03-04 DIAGNOSIS — M6281 Muscle weakness (generalized): Secondary | ICD-10-CM | POA: Diagnosis not present

## 2023-03-04 DIAGNOSIS — W19XXXA Unspecified fall, initial encounter: Secondary | ICD-10-CM | POA: Diagnosis not present

## 2023-03-04 DIAGNOSIS — R269 Unspecified abnormalities of gait and mobility: Secondary | ICD-10-CM | POA: Diagnosis not present

## 2023-03-04 DIAGNOSIS — N390 Urinary tract infection, site not specified: Secondary | ICD-10-CM | POA: Diagnosis not present

## 2023-03-20 DIAGNOSIS — W19XXXA Unspecified fall, initial encounter: Secondary | ICD-10-CM | POA: Diagnosis not present

## 2023-03-20 DIAGNOSIS — F0390 Unspecified dementia without behavioral disturbance: Secondary | ICD-10-CM | POA: Diagnosis not present

## 2023-03-20 DIAGNOSIS — M6281 Muscle weakness (generalized): Secondary | ICD-10-CM | POA: Diagnosis not present

## 2023-03-20 DIAGNOSIS — R269 Unspecified abnormalities of gait and mobility: Secondary | ICD-10-CM | POA: Diagnosis not present

## 2023-03-20 DIAGNOSIS — R2689 Other abnormalities of gait and mobility: Secondary | ICD-10-CM | POA: Diagnosis not present

## 2023-03-20 DIAGNOSIS — Z9181 History of falling: Secondary | ICD-10-CM | POA: Diagnosis not present

## 2023-03-21 DIAGNOSIS — Z9181 History of falling: Secondary | ICD-10-CM | POA: Diagnosis not present

## 2023-03-21 DIAGNOSIS — R269 Unspecified abnormalities of gait and mobility: Secondary | ICD-10-CM | POA: Diagnosis not present

## 2023-03-21 DIAGNOSIS — M6281 Muscle weakness (generalized): Secondary | ICD-10-CM | POA: Diagnosis not present

## 2023-03-21 DIAGNOSIS — F0390 Unspecified dementia without behavioral disturbance: Secondary | ICD-10-CM | POA: Diagnosis not present

## 2023-03-21 DIAGNOSIS — R2689 Other abnormalities of gait and mobility: Secondary | ICD-10-CM | POA: Diagnosis not present

## 2023-03-21 DIAGNOSIS — W19XXXA Unspecified fall, initial encounter: Secondary | ICD-10-CM | POA: Diagnosis not present

## 2023-03-25 ENCOUNTER — Ambulatory Visit: Payer: PPO | Admitting: Podiatry

## 2023-03-25 DIAGNOSIS — R269 Unspecified abnormalities of gait and mobility: Secondary | ICD-10-CM

## 2023-03-25 DIAGNOSIS — M25852 Other specified joint disorders, left hip: Secondary | ICD-10-CM | POA: Diagnosis not present

## 2023-03-25 DIAGNOSIS — Z9181 History of falling: Secondary | ICD-10-CM

## 2023-03-25 DIAGNOSIS — M79674 Pain in right toe(s): Secondary | ICD-10-CM

## 2023-03-25 DIAGNOSIS — B351 Tinea unguium: Secondary | ICD-10-CM

## 2023-03-25 DIAGNOSIS — E1151 Type 2 diabetes mellitus with diabetic peripheral angiopathy without gangrene: Secondary | ICD-10-CM

## 2023-03-25 DIAGNOSIS — M25552 Pain in left hip: Secondary | ICD-10-CM

## 2023-03-25 DIAGNOSIS — W19XXXA Unspecified fall, initial encounter: Secondary | ICD-10-CM

## 2023-03-25 DIAGNOSIS — M85652 Other cyst of bone, left thigh: Secondary | ICD-10-CM | POA: Diagnosis not present

## 2023-03-25 DIAGNOSIS — F0390 Unspecified dementia without behavioral disturbance: Secondary | ICD-10-CM

## 2023-03-25 DIAGNOSIS — M6281 Muscle weakness (generalized): Secondary | ICD-10-CM

## 2023-03-25 DIAGNOSIS — M79675 Pain in left toe(s): Secondary | ICD-10-CM

## 2023-03-25 DIAGNOSIS — R2689 Other abnormalities of gait and mobility: Secondary | ICD-10-CM

## 2023-03-25 NOTE — Progress Notes (Signed)
Subjective:  Patient ID: Edwin Burns, male    DOB: Oct 03, 1941,  MRN: [id number]  Chief Complaint  Patient presents with   Nail Problem    Routine foot care, Nail trim, right foot heel dry skin     82 y.o. male presents with the above complaint. History confirmed with patient. Patient presenting with pain related to dystrophic thickened elongated nails. Patient is unable to trim own nails related to nail dystrophy and/or mobility issues. Patient does have a history of T2DM.   Objective:  Physical Exam: warm, good capillary refill nail exam onychomycosis of the toenails, onycholysis, and dystrophic nails DP pulses palpable, PT pulses palpable, and protective sensation absent Left Foot:  Pain with palpation of nails due to elongation and dystrophic growth.  Right Foot: Pain with palpation of nails due to elongation and dystrophic growth.   Assessment:   1. Pain due to onychomycosis of toenails of both feet   2. Type II diabetes mellitus with peripheral circulatory disorder      Plan:  Patient was evaluated and treated and all questions answered.  #Onychomycosis with pain  -Nails palliatively debrided as below. -Educated on self-care  Procedure: Nail Debridement Rationale: Pain Type of Debridement: manual, sharp debridement. Instrumentation: Nail nipper, rotary burr. Number of Nails: 10  Return in about 3 months (around 06/24/2023) for [facility].         Corinna Gab, DPM [facility] / [facility]

## 2023-04-01 DIAGNOSIS — F0390 Unspecified dementia without behavioral disturbance: Secondary | ICD-10-CM | POA: Diagnosis not present

## 2023-04-01 DIAGNOSIS — L304 Erythema intertrigo: Secondary | ICD-10-CM | POA: Diagnosis not present

## 2023-04-01 DIAGNOSIS — L539 Erythematous condition, unspecified: Secondary | ICD-10-CM | POA: Diagnosis not present

## 2023-04-03 DIAGNOSIS — R269 Unspecified abnormalities of gait and mobility: Secondary | ICD-10-CM | POA: Diagnosis not present

## 2023-04-03 DIAGNOSIS — Z9181 History of falling: Secondary | ICD-10-CM | POA: Diagnosis not present

## 2023-04-03 DIAGNOSIS — R350 Frequency of micturition: Secondary | ICD-10-CM | POA: Diagnosis not present

## 2023-04-03 DIAGNOSIS — R296 Repeated falls: Secondary | ICD-10-CM | POA: Diagnosis not present

## 2023-04-03 DIAGNOSIS — M6281 Muscle weakness (generalized): Secondary | ICD-10-CM | POA: Diagnosis not present

## 2023-04-03 DIAGNOSIS — F0390 Unspecified dementia without behavioral disturbance: Secondary | ICD-10-CM | POA: Diagnosis not present

## 2023-04-08 DIAGNOSIS — N390 Urinary tract infection, site not specified: Secondary | ICD-10-CM | POA: Diagnosis not present

## 2023-04-11 ENCOUNTER — Ambulatory Visit: Payer: PPO | Admitting: Podiatry

## 2023-04-19 DIAGNOSIS — J449 Chronic obstructive pulmonary disease, unspecified: Secondary | ICD-10-CM | POA: Diagnosis not present

## 2023-04-19 DIAGNOSIS — I5032 Chronic diastolic (congestive) heart failure: Secondary | ICD-10-CM | POA: Diagnosis not present

## 2023-04-19 DIAGNOSIS — I745 Embolism and thrombosis of iliac artery: Secondary | ICD-10-CM | POA: Diagnosis not present

## 2023-04-19 DIAGNOSIS — E1169 Type 2 diabetes mellitus with other specified complication: Secondary | ICD-10-CM | POA: Diagnosis not present

## 2023-04-19 DIAGNOSIS — F0390 Unspecified dementia without behavioral disturbance: Secondary | ICD-10-CM | POA: Diagnosis not present

## 2023-04-19 DIAGNOSIS — I700 Atherosclerosis of aorta: Secondary | ICD-10-CM | POA: Diagnosis not present

## 2023-04-19 DIAGNOSIS — I110 Hypertensive heart disease with heart failure: Secondary | ICD-10-CM | POA: Diagnosis not present

## 2023-04-22 DIAGNOSIS — E119 Type 2 diabetes mellitus without complications: Secondary | ICD-10-CM | POA: Diagnosis not present

## 2023-04-22 DIAGNOSIS — D649 Anemia, unspecified: Secondary | ICD-10-CM | POA: Diagnosis not present

## 2023-04-22 DIAGNOSIS — I10 Essential (primary) hypertension: Secondary | ICD-10-CM | POA: Diagnosis not present

## 2023-04-24 DIAGNOSIS — R799 Abnormal finding of blood chemistry, unspecified: Secondary | ICD-10-CM

## 2023-04-24 DIAGNOSIS — F03918 Unspecified dementia, unspecified severity, with other behavioral disturbance: Secondary | ICD-10-CM | POA: Diagnosis not present

## 2023-04-24 DIAGNOSIS — E119 Type 2 diabetes mellitus without complications: Secondary | ICD-10-CM

## 2023-04-24 DIAGNOSIS — J9809 Other diseases of bronchus, not elsewhere classified: Secondary | ICD-10-CM | POA: Diagnosis not present

## 2023-04-24 DIAGNOSIS — F0390 Unspecified dementia without behavioral disturbance: Secondary | ICD-10-CM

## 2023-04-24 DIAGNOSIS — D72829 Elevated white blood cell count, unspecified: Secondary | ICD-10-CM

## 2023-04-24 DIAGNOSIS — F419 Anxiety disorder, unspecified: Secondary | ICD-10-CM | POA: Diagnosis not present

## 2023-04-24 DIAGNOSIS — D649 Anemia, unspecified: Secondary | ICD-10-CM

## 2023-04-24 DIAGNOSIS — J986 Disorders of diaphragm: Secondary | ICD-10-CM | POA: Diagnosis not present

## 2023-04-25 DIAGNOSIS — N390 Urinary tract infection, site not specified: Secondary | ICD-10-CM | POA: Diagnosis not present

## 2023-04-25 DIAGNOSIS — J209 Acute bronchitis, unspecified: Secondary | ICD-10-CM | POA: Diagnosis not present

## 2023-04-25 DIAGNOSIS — F0390 Unspecified dementia without behavioral disturbance: Secondary | ICD-10-CM | POA: Diagnosis not present

## 2023-04-29 DIAGNOSIS — S51012S Laceration without foreign body of left elbow, sequela: Secondary | ICD-10-CM | POA: Diagnosis not present

## 2023-04-29 DIAGNOSIS — Z9181 History of falling: Secondary | ICD-10-CM | POA: Diagnosis not present

## 2023-04-29 DIAGNOSIS — W19XXXA Unspecified fall, initial encounter: Secondary | ICD-10-CM | POA: Diagnosis not present

## 2023-04-29 DIAGNOSIS — R269 Unspecified abnormalities of gait and mobility: Secondary | ICD-10-CM | POA: Diagnosis not present

## 2023-04-29 DIAGNOSIS — R2689 Other abnormalities of gait and mobility: Secondary | ICD-10-CM | POA: Diagnosis not present

## 2023-04-29 DIAGNOSIS — M6281 Muscle weakness (generalized): Secondary | ICD-10-CM | POA: Diagnosis not present

## 2023-04-29 DIAGNOSIS — F0390 Unspecified dementia without behavioral disturbance: Secondary | ICD-10-CM | POA: Diagnosis not present

## 2023-05-03 DIAGNOSIS — F0390 Unspecified dementia without behavioral disturbance: Secondary | ICD-10-CM | POA: Diagnosis not present

## 2023-05-03 DIAGNOSIS — R600 Localized edema: Secondary | ICD-10-CM | POA: Diagnosis not present

## 2023-05-13 DIAGNOSIS — S8002XA Contusion of left knee, initial encounter: Secondary | ICD-10-CM

## 2023-05-13 DIAGNOSIS — R269 Unspecified abnormalities of gait and mobility: Secondary | ICD-10-CM

## 2023-05-13 DIAGNOSIS — M6281 Muscle weakness (generalized): Secondary | ICD-10-CM

## 2023-05-13 DIAGNOSIS — Z9181 History of falling: Secondary | ICD-10-CM

## 2023-05-13 DIAGNOSIS — F0390 Unspecified dementia without behavioral disturbance: Secondary | ICD-10-CM

## 2023-05-13 DIAGNOSIS — R2689 Other abnormalities of gait and mobility: Secondary | ICD-10-CM

## 2023-05-13 DIAGNOSIS — W19XXXA Unspecified fall, initial encounter: Secondary | ICD-10-CM

## 2023-05-29 DIAGNOSIS — R269 Unspecified abnormalities of gait and mobility: Secondary | ICD-10-CM | POA: Diagnosis not present

## 2023-05-29 DIAGNOSIS — Z9181 History of falling: Secondary | ICD-10-CM | POA: Diagnosis not present

## 2023-05-29 DIAGNOSIS — R451 Restlessness and agitation: Secondary | ICD-10-CM | POA: Diagnosis not present

## 2023-05-29 DIAGNOSIS — M6281 Muscle weakness (generalized): Secondary | ICD-10-CM | POA: Diagnosis not present

## 2023-05-29 DIAGNOSIS — F419 Anxiety disorder, unspecified: Secondary | ICD-10-CM | POA: Diagnosis not present

## 2023-05-29 DIAGNOSIS — W19XXXA Unspecified fall, initial encounter: Secondary | ICD-10-CM | POA: Diagnosis not present

## 2023-05-29 DIAGNOSIS — F0390 Unspecified dementia without behavioral disturbance: Secondary | ICD-10-CM | POA: Diagnosis not present

## 2023-05-29 DIAGNOSIS — R2689 Other abnormalities of gait and mobility: Secondary | ICD-10-CM | POA: Diagnosis not present

## 2023-05-29 DIAGNOSIS — F03918 Unspecified dementia, unspecified severity, with other behavioral disturbance: Secondary | ICD-10-CM | POA: Diagnosis not present

## 2023-06-14 DIAGNOSIS — Z9181 History of falling: Secondary | ICD-10-CM | POA: Diagnosis not present

## 2023-06-14 DIAGNOSIS — F0390 Unspecified dementia without behavioral disturbance: Secondary | ICD-10-CM | POA: Diagnosis not present

## 2023-06-14 DIAGNOSIS — R269 Unspecified abnormalities of gait and mobility: Secondary | ICD-10-CM | POA: Diagnosis not present

## 2023-06-14 DIAGNOSIS — W19XXXA Unspecified fall, initial encounter: Secondary | ICD-10-CM | POA: Diagnosis not present

## 2023-06-14 DIAGNOSIS — R2689 Other abnormalities of gait and mobility: Secondary | ICD-10-CM | POA: Diagnosis not present

## 2023-06-14 DIAGNOSIS — M6281 Muscle weakness (generalized): Secondary | ICD-10-CM | POA: Diagnosis not present

## 2023-06-25 ENCOUNTER — Ambulatory Visit: Payer: PPO | Admitting: Podiatry

## 2023-06-25 DIAGNOSIS — B351 Tinea unguium: Secondary | ICD-10-CM

## 2023-06-25 DIAGNOSIS — E1151 Type 2 diabetes mellitus with diabetic peripheral angiopathy without gangrene: Secondary | ICD-10-CM | POA: Diagnosis not present

## 2023-06-25 DIAGNOSIS — M79675 Pain in left toe(s): Secondary | ICD-10-CM

## 2023-06-25 DIAGNOSIS — M79674 Pain in right toe(s): Secondary | ICD-10-CM | POA: Diagnosis not present

## 2023-06-25 NOTE — Progress Notes (Signed)
Subjective:  Patient ID: Edwin Burns, male    DOB: [birth year],  MRN: [id number]  Diabetic routine foot care  82 y.o. male presents with the above complaint. History confirmed with patient. Patient presenting with pain related to dystrophic thickened elongated nails. Patient is unable to trim own nails related to nail dystrophy and/or mobility issues. Patient does have a history of T2DM.   Objective:  Physical Exam: warm, good capillary refill nail exam onychomycosis of the toenails, onycholysis, and dystrophic nails DP pulses palpable, PT pulses palpable, and protective sensation absent Left Foot:  Pain with palpation of nails due to elongation and dystrophic growth.  Right Foot: Pain with palpation of nails due to elongation and dystrophic growth.   Assessment:   1. Pain due to onychomycosis of toenails of both feet   2. Type II diabetes mellitus with peripheral circulatory disorder [facility])       Plan:  Patient was evaluated and treated and all questions answered.  #Onychomycosis with pain  -Nails palliatively debrided as below. -Educated on self-care  Procedure: Nail Debridement Rationale: Pain Type of Debridement: manual, sharp debridement. Instrumentation: Nail nipper, rotary burr. Number of Nails: 10  Return in about 3 months (around 09/25/2023) for [facility].         Corinna Gab, DPM [facility] / [facility]

## 2023-06-26 DIAGNOSIS — S81012A Laceration without foreign body, left knee, initial encounter: Secondary | ICD-10-CM | POA: Diagnosis not present

## 2023-06-26 DIAGNOSIS — Z9181 History of falling: Secondary | ICD-10-CM | POA: Diagnosis not present

## 2023-06-26 DIAGNOSIS — F03918 Unspecified dementia, unspecified severity, with other behavioral disturbance: Secondary | ICD-10-CM | POA: Diagnosis not present

## 2023-06-26 DIAGNOSIS — R269 Unspecified abnormalities of gait and mobility: Secondary | ICD-10-CM | POA: Diagnosis not present

## 2023-06-26 DIAGNOSIS — F419 Anxiety disorder, unspecified: Secondary | ICD-10-CM | POA: Diagnosis not present

## 2023-06-26 DIAGNOSIS — S51012A Laceration without foreign body of left elbow, initial encounter: Secondary | ICD-10-CM | POA: Diagnosis not present

## 2023-06-26 DIAGNOSIS — R451 Restlessness and agitation: Secondary | ICD-10-CM | POA: Diagnosis not present

## 2023-06-26 DIAGNOSIS — W19XXXA Unspecified fall, initial encounter: Secondary | ICD-10-CM | POA: Diagnosis not present

## 2023-06-26 DIAGNOSIS — F0390 Unspecified dementia without behavioral disturbance: Secondary | ICD-10-CM | POA: Diagnosis not present

## 2023-06-26 DIAGNOSIS — R2689 Other abnormalities of gait and mobility: Secondary | ICD-10-CM | POA: Diagnosis not present

## 2023-06-26 DIAGNOSIS — M6281 Muscle weakness (generalized): Secondary | ICD-10-CM | POA: Diagnosis not present

## 2023-07-03 DIAGNOSIS — W19XXXA Unspecified fall, initial encounter: Secondary | ICD-10-CM | POA: Diagnosis not present

## 2023-07-03 DIAGNOSIS — Z9181 History of falling: Secondary | ICD-10-CM | POA: Diagnosis not present

## 2023-07-03 DIAGNOSIS — F0390 Unspecified dementia without behavioral disturbance: Secondary | ICD-10-CM | POA: Diagnosis not present

## 2023-07-03 DIAGNOSIS — M6281 Muscle weakness (generalized): Secondary | ICD-10-CM | POA: Diagnosis not present

## 2023-07-03 DIAGNOSIS — R269 Unspecified abnormalities of gait and mobility: Secondary | ICD-10-CM | POA: Diagnosis not present

## 2023-07-03 DIAGNOSIS — S81011A Laceration without foreign body, right knee, initial encounter: Secondary | ICD-10-CM | POA: Diagnosis not present

## 2023-07-03 DIAGNOSIS — S81012A Laceration without foreign body, left knee, initial encounter: Secondary | ICD-10-CM | POA: Diagnosis not present

## 2023-07-03 DIAGNOSIS — R2689 Other abnormalities of gait and mobility: Secondary | ICD-10-CM | POA: Diagnosis not present

## 2023-07-05 DIAGNOSIS — S51012S Laceration without foreign body of left elbow, sequela: Secondary | ICD-10-CM | POA: Diagnosis not present

## 2023-07-05 DIAGNOSIS — R269 Unspecified abnormalities of gait and mobility: Secondary | ICD-10-CM | POA: Diagnosis not present

## 2023-07-05 DIAGNOSIS — F0390 Unspecified dementia without behavioral disturbance: Secondary | ICD-10-CM | POA: Diagnosis not present

## 2023-07-05 DIAGNOSIS — S81012S Laceration without foreign body, left knee, sequela: Secondary | ICD-10-CM | POA: Diagnosis not present

## 2023-07-05 DIAGNOSIS — M6281 Muscle weakness (generalized): Secondary | ICD-10-CM | POA: Diagnosis not present

## 2023-07-10 DIAGNOSIS — F0390 Unspecified dementia without behavioral disturbance: Secondary | ICD-10-CM | POA: Diagnosis not present

## 2023-07-10 DIAGNOSIS — I110 Hypertensive heart disease with heart failure: Secondary | ICD-10-CM | POA: Diagnosis not present

## 2023-07-10 DIAGNOSIS — I959 Hypotension, unspecified: Secondary | ICD-10-CM | POA: Diagnosis not present

## 2023-07-10 DIAGNOSIS — I5032 Chronic diastolic (congestive) heart failure: Secondary | ICD-10-CM | POA: Diagnosis not present

## 2023-07-11 DIAGNOSIS — M6281 Muscle weakness (generalized): Secondary | ICD-10-CM | POA: Diagnosis not present

## 2023-07-11 DIAGNOSIS — R269 Unspecified abnormalities of gait and mobility: Secondary | ICD-10-CM | POA: Diagnosis not present

## 2023-07-11 DIAGNOSIS — F0390 Unspecified dementia without behavioral disturbance: Secondary | ICD-10-CM | POA: Diagnosis not present

## 2023-07-11 DIAGNOSIS — L22 Diaper dermatitis: Secondary | ICD-10-CM | POA: Diagnosis not present

## 2023-07-12 DIAGNOSIS — L22 Diaper dermatitis: Secondary | ICD-10-CM | POA: Diagnosis not present

## 2023-07-12 DIAGNOSIS — L539 Erythematous condition, unspecified: Secondary | ICD-10-CM | POA: Diagnosis not present

## 2023-07-12 DIAGNOSIS — F0390 Unspecified dementia without behavioral disturbance: Secondary | ICD-10-CM | POA: Diagnosis not present

## 2023-07-12 DIAGNOSIS — R269 Unspecified abnormalities of gait and mobility: Secondary | ICD-10-CM | POA: Diagnosis not present

## 2023-08-12 DIAGNOSIS — M1990 Unspecified osteoarthritis, unspecified site: Secondary | ICD-10-CM | POA: Diagnosis not present

## 2023-08-12 DIAGNOSIS — G894 Chronic pain syndrome: Secondary | ICD-10-CM | POA: Diagnosis not present

## 2023-08-12 DIAGNOSIS — N390 Urinary tract infection, site not specified: Secondary | ICD-10-CM | POA: Diagnosis not present

## 2023-08-12 DIAGNOSIS — F0390 Unspecified dementia without behavioral disturbance: Secondary | ICD-10-CM | POA: Diagnosis not present

## 2023-08-21 DIAGNOSIS — F5105 Insomnia due to other mental disorder: Secondary | ICD-10-CM | POA: Diagnosis not present

## 2023-08-21 DIAGNOSIS — F419 Anxiety disorder, unspecified: Secondary | ICD-10-CM | POA: Diagnosis not present

## 2023-08-21 DIAGNOSIS — L22 Diaper dermatitis: Secondary | ICD-10-CM | POA: Diagnosis not present

## 2023-08-21 DIAGNOSIS — L539 Erythematous condition, unspecified: Secondary | ICD-10-CM | POA: Diagnosis not present

## 2023-08-21 DIAGNOSIS — F03918 Unspecified dementia, unspecified severity, with other behavioral disturbance: Secondary | ICD-10-CM | POA: Diagnosis not present

## 2023-08-21 DIAGNOSIS — L304 Erythema intertrigo: Secondary | ICD-10-CM | POA: Diagnosis not present

## 2023-08-22 DIAGNOSIS — M6281 Muscle weakness (generalized): Secondary | ICD-10-CM

## 2023-08-22 DIAGNOSIS — S81811A Laceration without foreign body, right lower leg, initial encounter: Secondary | ICD-10-CM

## 2023-08-22 DIAGNOSIS — R2689 Other abnormalities of gait and mobility: Secondary | ICD-10-CM

## 2023-08-22 DIAGNOSIS — W06XXXA Fall from bed, initial encounter: Secondary | ICD-10-CM

## 2023-08-22 DIAGNOSIS — Z9181 History of falling: Secondary | ICD-10-CM

## 2023-08-22 DIAGNOSIS — F0390 Unspecified dementia without behavioral disturbance: Secondary | ICD-10-CM

## 2023-09-04 DIAGNOSIS — I129 Hypertensive chronic kidney disease with stage 1 through stage 4 chronic kidney disease, or unspecified chronic kidney disease: Secondary | ICD-10-CM | POA: Diagnosis not present

## 2023-09-04 DIAGNOSIS — N1831 Chronic kidney disease, stage 3a: Secondary | ICD-10-CM | POA: Diagnosis not present

## 2023-09-04 DIAGNOSIS — M6281 Muscle weakness (generalized): Secondary | ICD-10-CM | POA: Diagnosis not present

## 2023-09-04 DIAGNOSIS — I5032 Chronic diastolic (congestive) heart failure: Secondary | ICD-10-CM | POA: Diagnosis not present

## 2023-09-04 DIAGNOSIS — I745 Embolism and thrombosis of iliac artery: Secondary | ICD-10-CM | POA: Diagnosis not present

## 2023-09-04 DIAGNOSIS — F0390 Unspecified dementia without behavioral disturbance: Secondary | ICD-10-CM | POA: Diagnosis not present

## 2023-09-04 DIAGNOSIS — Z6821 Body mass index (BMI) 21.0-21.9, adult: Secondary | ICD-10-CM | POA: Diagnosis not present

## 2023-09-04 DIAGNOSIS — E1122 Type 2 diabetes mellitus with diabetic chronic kidney disease: Secondary | ICD-10-CM | POA: Diagnosis not present

## 2023-09-04 DIAGNOSIS — J449 Chronic obstructive pulmonary disease, unspecified: Secondary | ICD-10-CM | POA: Diagnosis not present

## 2023-09-05 DIAGNOSIS — E785 Hyperlipidemia, unspecified: Secondary | ICD-10-CM | POA: Diagnosis not present

## 2023-09-05 DIAGNOSIS — I10 Essential (primary) hypertension: Secondary | ICD-10-CM | POA: Diagnosis not present

## 2023-09-05 DIAGNOSIS — E039 Hypothyroidism, unspecified: Secondary | ICD-10-CM | POA: Diagnosis not present

## 2023-09-05 DIAGNOSIS — E559 Vitamin D deficiency, unspecified: Secondary | ICD-10-CM | POA: Diagnosis not present

## 2023-09-05 DIAGNOSIS — E119 Type 2 diabetes mellitus without complications: Secondary | ICD-10-CM | POA: Diagnosis not present

## 2023-09-05 DIAGNOSIS — D649 Anemia, unspecified: Secondary | ICD-10-CM | POA: Diagnosis not present

## 2023-09-11 DIAGNOSIS — D649 Anemia, unspecified: Secondary | ICD-10-CM | POA: Diagnosis not present

## 2023-09-11 DIAGNOSIS — E8809 Other disorders of plasma-protein metabolism, not elsewhere classified: Secondary | ICD-10-CM | POA: Diagnosis not present

## 2023-09-11 DIAGNOSIS — E785 Hyperlipidemia, unspecified: Secondary | ICD-10-CM | POA: Diagnosis not present

## 2023-09-11 DIAGNOSIS — F0390 Unspecified dementia without behavioral disturbance: Secondary | ICD-10-CM | POA: Diagnosis not present

## 2023-09-11 DIAGNOSIS — E559 Vitamin D deficiency, unspecified: Secondary | ICD-10-CM | POA: Diagnosis not present

## 2023-09-27 DIAGNOSIS — Z23 Encounter for immunization: Secondary | ICD-10-CM | POA: Diagnosis not present

## 2023-09-30 DIAGNOSIS — R6520 Severe sepsis without septic shock: Secondary | ICD-10-CM | POA: Diagnosis not present

## 2023-09-30 DIAGNOSIS — I510 Cardiac septal defect, acquired: Secondary | ICD-10-CM | POA: Diagnosis not present

## 2023-09-30 DIAGNOSIS — R0689 Other abnormalities of breathing: Secondary | ICD-10-CM | POA: Diagnosis not present

## 2023-09-30 DIAGNOSIS — R1110 Vomiting, unspecified: Secondary | ICD-10-CM | POA: Diagnosis not present

## 2023-09-30 DIAGNOSIS — I10 Essential (primary) hypertension: Secondary | ICD-10-CM | POA: Diagnosis not present

## 2023-09-30 DIAGNOSIS — N3289 Other specified disorders of bladder: Secondary | ICD-10-CM | POA: Diagnosis not present

## 2023-09-30 DIAGNOSIS — J849 Interstitial pulmonary disease, unspecified: Secondary | ICD-10-CM | POA: Diagnosis not present

## 2023-09-30 DIAGNOSIS — R0902 Hypoxemia: Secondary | ICD-10-CM | POA: Diagnosis not present

## 2023-09-30 DIAGNOSIS — R1084 Generalized abdominal pain: Secondary | ICD-10-CM | POA: Diagnosis not present

## 2023-09-30 DIAGNOSIS — R9431 Abnormal electrocardiogram [ECG] [EKG]: Secondary | ICD-10-CM | POA: Diagnosis not present

## 2023-09-30 DIAGNOSIS — R509 Fever, unspecified: Secondary | ICD-10-CM | POA: Diagnosis not present

## 2023-09-30 DIAGNOSIS — R404 Transient alteration of awareness: Secondary | ICD-10-CM | POA: Diagnosis not present

## 2023-09-30 DIAGNOSIS — A419 Sepsis, unspecified organism: Secondary | ICD-10-CM | POA: Diagnosis not present

## 2023-09-30 DIAGNOSIS — R0989 Other specified symptoms and signs involving the circulatory and respiratory systems: Secondary | ICD-10-CM | POA: Diagnosis not present

## 2023-09-30 DIAGNOSIS — I4519 Other right bundle-branch block: Secondary | ICD-10-CM | POA: Diagnosis not present

## 2023-09-30 DIAGNOSIS — N400 Enlarged prostate without lower urinary tract symptoms: Secondary | ICD-10-CM | POA: Diagnosis not present

## 2023-09-30 DIAGNOSIS — N3000 Acute cystitis without hematuria: Secondary | ICD-10-CM | POA: Diagnosis not present

## 2023-09-30 DIAGNOSIS — K8042 Calculus of bile duct with acute cholecystitis without obstruction: Secondary | ICD-10-CM | POA: Diagnosis not present

## 2023-09-30 DIAGNOSIS — J449 Chronic obstructive pulmonary disease, unspecified: Secondary | ICD-10-CM | POA: Diagnosis not present

## 2023-10-01 DIAGNOSIS — J449 Chronic obstructive pulmonary disease, unspecified: Secondary | ICD-10-CM | POA: Diagnosis not present

## 2023-10-01 DIAGNOSIS — I499 Cardiac arrhythmia, unspecified: Secondary | ICD-10-CM | POA: Diagnosis not present

## 2023-10-01 DIAGNOSIS — N390 Urinary tract infection, site not specified: Secondary | ICD-10-CM | POA: Diagnosis not present

## 2023-10-01 DIAGNOSIS — R6521 Severe sepsis with septic shock: Secondary | ICD-10-CM | POA: Diagnosis not present

## 2023-10-01 DIAGNOSIS — F03918 Unspecified dementia, unspecified severity, with other behavioral disturbance: Secondary | ICD-10-CM | POA: Diagnosis not present

## 2023-10-01 DIAGNOSIS — I69320 Aphasia following cerebral infarction: Secondary | ICD-10-CM | POA: Diagnosis not present

## 2023-10-01 DIAGNOSIS — I480 Paroxysmal atrial fibrillation: Secondary | ICD-10-CM | POA: Diagnosis not present

## 2023-10-01 DIAGNOSIS — K820 Obstruction of gallbladder: Secondary | ICD-10-CM | POA: Diagnosis not present

## 2023-10-01 DIAGNOSIS — E119 Type 2 diabetes mellitus without complications: Secondary | ICD-10-CM | POA: Diagnosis not present

## 2023-10-01 DIAGNOSIS — M6281 Muscle weakness (generalized): Secondary | ICD-10-CM | POA: Diagnosis not present

## 2023-10-01 DIAGNOSIS — I708 Atherosclerosis of other arteries: Secondary | ICD-10-CM | POA: Diagnosis not present

## 2023-10-01 DIAGNOSIS — I083 Combined rheumatic disorders of mitral, aortic and tricuspid valves: Secondary | ICD-10-CM | POA: Diagnosis not present

## 2023-10-01 DIAGNOSIS — K8062 Calculus of gallbladder and bile duct with acute cholecystitis without obstruction: Secondary | ICD-10-CM | POA: Diagnosis not present

## 2023-10-01 DIAGNOSIS — Z66 Do not resuscitate: Secondary | ICD-10-CM | POA: Diagnosis not present

## 2023-10-01 DIAGNOSIS — A419 Sepsis, unspecified organism: Secondary | ICD-10-CM | POA: Diagnosis not present

## 2023-10-01 DIAGNOSIS — R41841 Cognitive communication deficit: Secondary | ICD-10-CM | POA: Diagnosis not present

## 2023-10-01 DIAGNOSIS — I340 Nonrheumatic mitral (valve) insufficiency: Secondary | ICD-10-CM | POA: Diagnosis not present

## 2023-10-01 DIAGNOSIS — I639 Cerebral infarction, unspecified: Secondary | ICD-10-CM | POA: Diagnosis not present

## 2023-10-01 DIAGNOSIS — K819 Cholecystitis, unspecified: Secondary | ICD-10-CM | POA: Diagnosis not present

## 2023-10-01 DIAGNOSIS — I4891 Unspecified atrial fibrillation: Secondary | ICD-10-CM | POA: Diagnosis not present

## 2023-10-01 DIAGNOSIS — R7401 Elevation of levels of liver transaminase levels: Secondary | ICD-10-CM | POA: Diagnosis not present

## 2023-10-01 DIAGNOSIS — K851 Biliary acute pancreatitis without necrosis or infection: Secondary | ICD-10-CM | POA: Diagnosis not present

## 2023-10-01 DIAGNOSIS — Z7982 Long term (current) use of aspirin: Secondary | ICD-10-CM | POA: Diagnosis not present

## 2023-10-01 DIAGNOSIS — I4430 Unspecified atrioventricular block: Secondary | ICD-10-CM | POA: Diagnosis not present

## 2023-10-01 DIAGNOSIS — R2681 Unsteadiness on feet: Secondary | ICD-10-CM | POA: Diagnosis not present

## 2023-10-01 DIAGNOSIS — F0390 Unspecified dementia without behavioral disturbance: Secondary | ICD-10-CM | POA: Diagnosis not present

## 2023-10-01 DIAGNOSIS — I4719 Other supraventricular tachycardia: Secondary | ICD-10-CM | POA: Diagnosis not present

## 2023-10-01 DIAGNOSIS — Z9889 Other specified postprocedural states: Secondary | ICD-10-CM | POA: Diagnosis not present

## 2023-10-01 DIAGNOSIS — G9341 Metabolic encephalopathy: Secondary | ICD-10-CM | POA: Diagnosis not present

## 2023-10-01 DIAGNOSIS — K828 Other specified diseases of gallbladder: Secondary | ICD-10-CM | POA: Diagnosis not present

## 2023-10-01 DIAGNOSIS — I491 Atrial premature depolarization: Secondary | ICD-10-CM | POA: Diagnosis not present

## 2023-10-01 DIAGNOSIS — I5020 Unspecified systolic (congestive) heart failure: Secondary | ICD-10-CM | POA: Diagnosis not present

## 2023-10-01 DIAGNOSIS — R4189 Other symptoms and signs involving cognitive functions and awareness: Secondary | ICD-10-CM | POA: Diagnosis not present

## 2023-10-01 DIAGNOSIS — J8410 Pulmonary fibrosis, unspecified: Secondary | ICD-10-CM | POA: Diagnosis not present

## 2023-10-01 DIAGNOSIS — K8689 Other specified diseases of pancreas: Secondary | ICD-10-CM | POA: Diagnosis not present

## 2023-10-01 DIAGNOSIS — I700 Atherosclerosis of aorta: Secondary | ICD-10-CM | POA: Diagnosis not present

## 2023-10-01 DIAGNOSIS — I10 Essential (primary) hypertension: Secondary | ICD-10-CM | POA: Diagnosis not present

## 2023-10-01 DIAGNOSIS — E7800 Pure hypercholesterolemia, unspecified: Secondary | ICD-10-CM | POA: Diagnosis not present

## 2023-10-01 DIAGNOSIS — I110 Hypertensive heart disease with heart failure: Secondary | ICD-10-CM | POA: Diagnosis not present

## 2023-10-01 DIAGNOSIS — Z743 Need for continuous supervision: Secondary | ICD-10-CM | POA: Diagnosis not present

## 2023-10-01 DIAGNOSIS — K831 Obstruction of bile duct: Secondary | ICD-10-CM | POA: Diagnosis not present

## 2023-10-01 DIAGNOSIS — K8309 Other cholangitis: Secondary | ICD-10-CM | POA: Diagnosis not present

## 2023-10-02 DIAGNOSIS — K8309 Other cholangitis: Secondary | ICD-10-CM | POA: Diagnosis not present

## 2023-10-03 DIAGNOSIS — K8309 Other cholangitis: Secondary | ICD-10-CM | POA: Diagnosis not present

## 2023-10-03 DIAGNOSIS — K851 Biliary acute pancreatitis without necrosis or infection: Secondary | ICD-10-CM | POA: Diagnosis not present

## 2023-10-03 DIAGNOSIS — K828 Other specified diseases of gallbladder: Secondary | ICD-10-CM | POA: Diagnosis not present

## 2023-10-04 DIAGNOSIS — K8309 Other cholangitis: Secondary | ICD-10-CM | POA: Diagnosis not present

## 2023-10-04 DIAGNOSIS — K8689 Other specified diseases of pancreas: Secondary | ICD-10-CM | POA: Diagnosis not present

## 2023-10-04 DIAGNOSIS — K819 Cholecystitis, unspecified: Secondary | ICD-10-CM | POA: Diagnosis not present

## 2023-10-04 DIAGNOSIS — I083 Combined rheumatic disorders of mitral, aortic and tricuspid valves: Secondary | ICD-10-CM | POA: Diagnosis not present

## 2023-10-04 DIAGNOSIS — Z9889 Other specified postprocedural states: Secondary | ICD-10-CM | POA: Diagnosis not present

## 2023-10-05 DIAGNOSIS — K8309 Other cholangitis: Secondary | ICD-10-CM | POA: Diagnosis not present

## 2023-10-06 DIAGNOSIS — K8309 Other cholangitis: Secondary | ICD-10-CM | POA: Diagnosis not present

## 2023-10-07 DIAGNOSIS — K8309 Other cholangitis: Secondary | ICD-10-CM | POA: Diagnosis not present

## 2023-10-08 DIAGNOSIS — K8309 Other cholangitis: Secondary | ICD-10-CM | POA: Diagnosis not present

## 2023-10-08 DIAGNOSIS — I499 Cardiac arrhythmia, unspecified: Secondary | ICD-10-CM | POA: Diagnosis not present

## 2023-10-09 DIAGNOSIS — K8309 Other cholangitis: Secondary | ICD-10-CM | POA: Diagnosis not present

## 2023-10-10 DIAGNOSIS — I4430 Unspecified atrioventricular block: Secondary | ICD-10-CM | POA: Diagnosis not present

## 2023-10-10 DIAGNOSIS — K8309 Other cholangitis: Secondary | ICD-10-CM | POA: Diagnosis not present

## 2023-10-10 DIAGNOSIS — I4891 Unspecified atrial fibrillation: Secondary | ICD-10-CM | POA: Diagnosis not present

## 2023-10-10 DIAGNOSIS — I4719 Other supraventricular tachycardia: Secondary | ICD-10-CM | POA: Diagnosis not present

## 2023-10-10 DIAGNOSIS — I491 Atrial premature depolarization: Secondary | ICD-10-CM | POA: Diagnosis not present

## 2023-10-11 DIAGNOSIS — K8309 Other cholangitis: Secondary | ICD-10-CM | POA: Diagnosis not present

## 2023-10-12 DIAGNOSIS — K8309 Other cholangitis: Secondary | ICD-10-CM | POA: Diagnosis not present

## 2023-10-12 DIAGNOSIS — I4891 Unspecified atrial fibrillation: Secondary | ICD-10-CM | POA: Diagnosis not present

## 2023-10-12 DIAGNOSIS — R262 Difficulty in walking, not elsewhere classified: Secondary | ICD-10-CM | POA: Diagnosis not present

## 2023-10-12 DIAGNOSIS — I5022 Chronic systolic (congestive) heart failure: Secondary | ICD-10-CM | POA: Diagnosis not present

## 2023-10-30 DIAGNOSIS — L89152 Pressure ulcer of sacral region, stage 2: Secondary | ICD-10-CM | POA: Diagnosis not present

## 2023-11-06 DIAGNOSIS — L89152 Pressure ulcer of sacral region, stage 2: Secondary | ICD-10-CM | POA: Diagnosis not present

## 2023-11-13 DIAGNOSIS — L89152 Pressure ulcer of sacral region, stage 2: Secondary | ICD-10-CM | POA: Diagnosis not present

## 2023-11-20 DIAGNOSIS — L89152 Pressure ulcer of sacral region, stage 2: Secondary | ICD-10-CM | POA: Diagnosis not present

## 2023-11-21 DIAGNOSIS — F0390 Unspecified dementia without behavioral disturbance: Secondary | ICD-10-CM | POA: Diagnosis not present

## 2023-11-21 DIAGNOSIS — E441 Mild protein-calorie malnutrition: Secondary | ICD-10-CM | POA: Diagnosis not present

## 2023-11-21 DIAGNOSIS — N400 Enlarged prostate without lower urinary tract symptoms: Secondary | ICD-10-CM | POA: Diagnosis not present

## 2023-11-21 DIAGNOSIS — I119 Hypertensive heart disease without heart failure: Secondary | ICD-10-CM | POA: Diagnosis not present

## 2023-11-27 DIAGNOSIS — L89152 Pressure ulcer of sacral region, stage 2: Secondary | ICD-10-CM | POA: Diagnosis not present

## 2023-11-27 DIAGNOSIS — N390 Urinary tract infection, site not specified: Secondary | ICD-10-CM | POA: Diagnosis not present

## 2023-11-27 DIAGNOSIS — I10 Essential (primary) hypertension: Secondary | ICD-10-CM | POA: Diagnosis not present

## 2023-11-27 DIAGNOSIS — R6889 Other general symptoms and signs: Secondary | ICD-10-CM | POA: Diagnosis not present

## 2023-11-27 DIAGNOSIS — I5022 Chronic systolic (congestive) heart failure: Secondary | ICD-10-CM | POA: Diagnosis not present

## 2023-12-11 DIAGNOSIS — E559 Vitamin D deficiency, unspecified: Secondary | ICD-10-CM | POA: Diagnosis not present

## 2023-12-11 DIAGNOSIS — Z79899 Other long term (current) drug therapy: Secondary | ICD-10-CM | POA: Diagnosis not present

## 2023-12-11 DIAGNOSIS — E119 Type 2 diabetes mellitus without complications: Secondary | ICD-10-CM | POA: Diagnosis not present

## 2023-12-11 DIAGNOSIS — S31809A Unspecified open wound of unspecified buttock, initial encounter: Secondary | ICD-10-CM | POA: Diagnosis not present

## 2023-12-12 DIAGNOSIS — Z79899 Other long term (current) drug therapy: Secondary | ICD-10-CM | POA: Diagnosis not present

## 2023-12-12 DIAGNOSIS — I10 Essential (primary) hypertension: Secondary | ICD-10-CM | POA: Diagnosis not present

## 2023-12-12 DIAGNOSIS — E559 Vitamin D deficiency, unspecified: Secondary | ICD-10-CM | POA: Diagnosis not present

## 2023-12-16 DIAGNOSIS — S31809A Unspecified open wound of unspecified buttock, initial encounter: Secondary | ICD-10-CM | POA: Diagnosis not present

## 2023-12-23 DIAGNOSIS — I119 Hypertensive heart disease without heart failure: Secondary | ICD-10-CM | POA: Diagnosis not present

## 2023-12-23 DIAGNOSIS — J9621 Acute and chronic respiratory failure with hypoxia: Secondary | ICD-10-CM | POA: Diagnosis not present

## 2023-12-23 DIAGNOSIS — F0390 Unspecified dementia without behavioral disturbance: Secondary | ICD-10-CM | POA: Diagnosis not present

## 2023-12-23 DIAGNOSIS — R5381 Other malaise: Secondary | ICD-10-CM | POA: Diagnosis not present

## 2024-01-01 DIAGNOSIS — L988 Other specified disorders of the skin and subcutaneous tissue: Secondary | ICD-10-CM | POA: Diagnosis not present

## 2024-01-08 DIAGNOSIS — L988 Other specified disorders of the skin and subcutaneous tissue: Secondary | ICD-10-CM | POA: Diagnosis not present

## 2024-01-22 DIAGNOSIS — E441 Mild protein-calorie malnutrition: Secondary | ICD-10-CM | POA: Diagnosis not present

## 2024-01-22 DIAGNOSIS — I5022 Chronic systolic (congestive) heart failure: Secondary | ICD-10-CM | POA: Diagnosis not present

## 2024-01-22 DIAGNOSIS — R1310 Dysphagia, unspecified: Secondary | ICD-10-CM | POA: Diagnosis not present

## 2024-01-22 DIAGNOSIS — F0390 Unspecified dementia without behavioral disturbance: Secondary | ICD-10-CM | POA: Diagnosis not present

## 2024-02-21 DIAGNOSIS — N400 Enlarged prostate without lower urinary tract symptoms: Secondary | ICD-10-CM | POA: Diagnosis not present

## 2024-02-21 DIAGNOSIS — F0390 Unspecified dementia without behavioral disturbance: Secondary | ICD-10-CM | POA: Diagnosis not present

## 2024-02-21 DIAGNOSIS — R5381 Other malaise: Secondary | ICD-10-CM | POA: Diagnosis not present

## 2024-02-21 DIAGNOSIS — I4891 Unspecified atrial fibrillation: Secondary | ICD-10-CM | POA: Diagnosis not present

## 2024-03-20 DIAGNOSIS — R1310 Dysphagia, unspecified: Secondary | ICD-10-CM | POA: Diagnosis not present

## 2024-03-20 DIAGNOSIS — J9611 Chronic respiratory failure with hypoxia: Secondary | ICD-10-CM | POA: Diagnosis not present

## 2024-03-20 DIAGNOSIS — E441 Mild protein-calorie malnutrition: Secondary | ICD-10-CM | POA: Diagnosis not present

## 2024-03-20 DIAGNOSIS — R5381 Other malaise: Secondary | ICD-10-CM | POA: Diagnosis not present

## 2024-04-01 DIAGNOSIS — L89216 Pressure-induced deep tissue damage of right hip: Secondary | ICD-10-CM | POA: Diagnosis not present

## 2024-04-08 DIAGNOSIS — L89216 Pressure-induced deep tissue damage of right hip: Secondary | ICD-10-CM | POA: Diagnosis not present

## 2024-04-15 DIAGNOSIS — L89216 Pressure-induced deep tissue damage of right hip: Secondary | ICD-10-CM | POA: Diagnosis not present

## 2024-04-16 DIAGNOSIS — R062 Wheezing: Secondary | ICD-10-CM | POA: Diagnosis not present

## 2024-04-16 DIAGNOSIS — R509 Fever, unspecified: Secondary | ICD-10-CM | POA: Diagnosis not present

## 2024-04-21 DIAGNOSIS — N400 Enlarged prostate without lower urinary tract symptoms: Secondary | ICD-10-CM | POA: Diagnosis not present

## 2024-04-21 DIAGNOSIS — I5022 Chronic systolic (congestive) heart failure: Secondary | ICD-10-CM | POA: Diagnosis not present

## 2024-04-21 DIAGNOSIS — M2550 Pain in unspecified joint: Secondary | ICD-10-CM | POA: Diagnosis not present

## 2024-04-21 DIAGNOSIS — L89159 Pressure ulcer of sacral region, unspecified stage: Secondary | ICD-10-CM | POA: Diagnosis not present

## 2024-04-22 DIAGNOSIS — L89210 Pressure ulcer of right hip, unstageable: Secondary | ICD-10-CM | POA: Diagnosis not present

## 2024-04-29 DIAGNOSIS — L89213 Pressure ulcer of right hip, stage 3: Secondary | ICD-10-CM | POA: Diagnosis not present

## 2024-05-06 DIAGNOSIS — L89213 Pressure ulcer of right hip, stage 3: Secondary | ICD-10-CM | POA: Diagnosis not present

## 2024-05-06 DIAGNOSIS — L89523 Pressure ulcer of left ankle, stage 3: Secondary | ICD-10-CM | POA: Diagnosis not present

## 2024-05-06 DIAGNOSIS — L89313 Pressure ulcer of right buttock, stage 3: Secondary | ICD-10-CM | POA: Diagnosis not present

## 2024-05-11 DIAGNOSIS — J189 Pneumonia, unspecified organism: Secondary | ICD-10-CM | POA: Diagnosis not present

## 2024-05-11 DIAGNOSIS — G9340 Encephalopathy, unspecified: Secondary | ICD-10-CM | POA: Diagnosis not present

## 2024-05-11 DIAGNOSIS — R5381 Other malaise: Secondary | ICD-10-CM | POA: Diagnosis not present

## 2024-05-11 DIAGNOSIS — J9621 Acute and chronic respiratory failure with hypoxia: Secondary | ICD-10-CM | POA: Diagnosis not present

## 2024-05-14 DIAGNOSIS — L89213 Pressure ulcer of right hip, stage 3: Secondary | ICD-10-CM | POA: Diagnosis not present

## 2024-05-14 DIAGNOSIS — L89523 Pressure ulcer of left ankle, stage 3: Secondary | ICD-10-CM | POA: Diagnosis not present

## 2024-05-14 DIAGNOSIS — L89313 Pressure ulcer of right buttock, stage 3: Secondary | ICD-10-CM | POA: Diagnosis not present

## 2024-05-18 DIAGNOSIS — R0603 Acute respiratory distress: Secondary | ICD-10-CM | POA: Diagnosis not present

## 2024-05-20 DIAGNOSIS — L89213 Pressure ulcer of right hip, stage 3: Secondary | ICD-10-CM | POA: Diagnosis not present

## 2024-05-20 DIAGNOSIS — L89313 Pressure ulcer of right buttock, stage 3: Secondary | ICD-10-CM | POA: Diagnosis not present

## 2024-05-20 DIAGNOSIS — L89523 Pressure ulcer of left ankle, stage 3: Secondary | ICD-10-CM | POA: Diagnosis not present

## 2024-06-23 DEATH — deceased
